# Patient Record
Sex: Female | Born: 1960 | Race: Black or African American | Hispanic: No | Marital: Married | Smoking: Never smoker
Health system: Southern US, Community
[De-identification: ages and names within clinical notes are randomized; demographics above are authoritative.]

## PROBLEM LIST (undated history)

## (undated) DIAGNOSIS — T7840XA Allergy, unspecified, initial encounter: Secondary | ICD-10-CM

## (undated) DIAGNOSIS — I10 Essential (primary) hypertension: Secondary | ICD-10-CM

## (undated) DIAGNOSIS — D649 Anemia, unspecified: Secondary | ICD-10-CM

## (undated) DIAGNOSIS — M1990 Unspecified osteoarthritis, unspecified site: Secondary | ICD-10-CM

## (undated) DIAGNOSIS — F419 Anxiety disorder, unspecified: Secondary | ICD-10-CM

## (undated) HISTORY — PX: COLONOSCOPY: SHX174

## (undated) HISTORY — PX: ENDOMETRIAL ABLATION: SHX621

## (undated) HISTORY — DX: Anxiety disorder, unspecified: F41.9

## (undated) HISTORY — DX: Allergy, unspecified, initial encounter: T78.40XA

## (undated) HISTORY — PX: BREAST SURGERY: SHX581

## (undated) HISTORY — PX: TUBAL LIGATION: SHX77

## (undated) HISTORY — DX: Unspecified osteoarthritis, unspecified site: M19.90

## (undated) HISTORY — DX: Anemia, unspecified: D64.9

## (undated) HISTORY — PX: HAND SURGERY: SHX662

---

## 2006-12-25 HISTORY — PX: KNEE SURGERY: SHX244

## 2008-11-29 ENCOUNTER — Emergency Department (HOSPITAL_BASED_OUTPATIENT_CLINIC_OR_DEPARTMENT_OTHER): Admission: EM | Admit: 2008-11-29 | Discharge: 2008-11-29 | Payer: Self-pay | Admitting: Emergency Medicine

## 2010-12-09 ENCOUNTER — Encounter: Payer: Self-pay | Admitting: Internal Medicine

## 2010-12-09 ENCOUNTER — Encounter: Payer: Self-pay | Admitting: Gastroenterology

## 2010-12-09 ENCOUNTER — Other Ambulatory Visit: Payer: Self-pay | Admitting: Internal Medicine

## 2010-12-09 ENCOUNTER — Ambulatory Visit (INDEPENDENT_AMBULATORY_CARE_PROVIDER_SITE_OTHER): Payer: PRIVATE HEALTH INSURANCE | Admitting: Internal Medicine

## 2010-12-09 VITALS — BP 128/80 | HR 89 | Temp 97.7°F | Resp 12 | Ht 61.0 in | Wt 175.0 lb

## 2010-12-09 DIAGNOSIS — J310 Chronic rhinitis: Secondary | ICD-10-CM | POA: Insufficient documentation

## 2010-12-09 DIAGNOSIS — I10 Essential (primary) hypertension: Secondary | ICD-10-CM | POA: Insufficient documentation

## 2010-12-09 DIAGNOSIS — G43909 Migraine, unspecified, not intractable, without status migrainosus: Secondary | ICD-10-CM | POA: Insufficient documentation

## 2010-12-09 DIAGNOSIS — E669 Obesity, unspecified: Secondary | ICD-10-CM

## 2010-12-09 DIAGNOSIS — M129 Arthropathy, unspecified: Secondary | ICD-10-CM

## 2010-12-09 DIAGNOSIS — T7840XA Allergy, unspecified, initial encounter: Secondary | ICD-10-CM | POA: Insufficient documentation

## 2010-12-09 DIAGNOSIS — M1990 Unspecified osteoarthritis, unspecified site: Secondary | ICD-10-CM | POA: Insufficient documentation

## 2010-12-09 DIAGNOSIS — D649 Anemia, unspecified: Secondary | ICD-10-CM

## 2010-12-09 DIAGNOSIS — E559 Vitamin D deficiency, unspecified: Secondary | ICD-10-CM

## 2010-12-09 LAB — CBC WITH DIFFERENTIAL/PLATELET
Basophils Relative: 1 % (ref 0–1)
HCT: 36.2 % (ref 36.0–46.0)
Hemoglobin: 11.6 g/dL — ABNORMAL LOW (ref 12.0–15.0)
Lymphocytes Relative: 36 % (ref 12–46)
Lymphs Abs: 1.9 10*3/uL (ref 0.7–4.0)
MCHC: 32 g/dL (ref 30.0–36.0)
Monocytes Absolute: 0.3 10*3/uL (ref 0.1–1.0)
Monocytes Relative: 6 % (ref 3–12)
Neutro Abs: 2.8 10*3/uL (ref 1.7–7.7)
Neutrophils Relative %: 54 % (ref 43–77)
RBC: 5.22 MIL/uL — ABNORMAL HIGH (ref 3.87–5.11)
WBC: 5.3 10*3/uL (ref 4.0–10.5)

## 2010-12-09 LAB — LIPID PANEL
LDL Cholesterol: 63 mg/dL (ref 0–99)
Triglycerides: 85 mg/dL (ref <150)

## 2010-12-09 LAB — COMPREHENSIVE METABOLIC PANEL
AST: 21 U/L (ref 0–37)
Albumin: 4.4 g/dL (ref 3.5–5.2)
Alkaline Phosphatase: 117 U/L (ref 39–117)
Calcium: 9.3 mg/dL (ref 8.4–10.5)
Chloride: 102 mEq/L (ref 96–112)
Glucose, Bld: 80 mg/dL (ref 70–99)
Potassium: 3.8 mEq/L (ref 3.5–5.3)
Sodium: 141 mEq/L (ref 135–145)
Total Protein: 7.8 g/dL (ref 6.0–8.3)

## 2010-12-09 LAB — TSH: TSH: 3.035 u[IU]/mL (ref 0.350–4.500)

## 2010-12-09 MED ORDER — DICLOFENAC SODIUM 1 % TD GEL: TRANSDERMAL | Status: DC

## 2010-12-09 NOTE — Progress Notes (Signed)
Subjective:     Patient ID: Samantha Horton, female   DOB: June 03, 1960, 50 y.o.   MRN: [id number]  HPI  New pt. First visit.  Overall doing well.  Concerned over weight gain.  HAD been on Phentermine and weight watcher in past and had lost over 30 lbbs.  Has gained bac 12 lbs.  Also would like to discuss ooptions of alternatives for knee arthritis.  Does not wish to take Meloxicam daily.  Had lazer surgery by Dr. Samuel Bouche on R knee.  No redness, edema or warmth to knee now  Allergies  Allergen Reactions  . Codeine Nausea And Vomiting  . Vicodin (Hydrocodone-Acetaminophen) Nausea And Vomiting   Past Medical History  Diagnosis Date  . Anemia   . Arthritis   . Headache     history of migraine per pt  . Allergy     allergic rhinitis   Past Surgical History  Procedure Date  . Knee surgery 12/2006    Right  . Endometrial ablation    History   Social History  . Marital Status: Married    Spouse Name: N/A    Number of Children: 3  . Years of Education: HS grad   Occupational History  . International aid/development worker    Social History Main Topics  . Smoking status: Never Smoker   . Smokeless tobacco: Never Used  . Alcohol Use: No  . Drug Use: No  . Sexually Active: Yes -- Female partner(s)   Other Topics Concern  . Not on file   Social History Narrative  . No narrative on file   Family History  Problem Relation Age of Onset  . Hypertension Mother   . Migraines Mother   . Arthritis Mother   . Hypertension Father    Patient Active Problem List  Diagnoses  . Essential hypertension, benign  . Arthritis  . Migraine  . Obesity  . Anemia  . Rhinitis  . Allergy         Review of Systems See HPI    Objective:   Physical Exam  Constitutional: She appears well-developed and well-nourished.  HENT:  Head: Normocephalic and atraumatic.  Neck: No thyromegaly present.  Cardiovascular: Normal rate.  Exam reveals no gallop and no friction rub.   No murmur heard.      No LE edema    Pulmonary/Chest: Breath sounds normal. She has no wheezes. She has no rales.  Musculoskeletal: She exhibits no edema and no tenderness.  Skin: Skin is warm. No rash noted.  Psychiatric: She has a normal mood and affect. Her behavior is normal.       Assessment:   Obesity with recent wt gain  Arthritis Complete problem list: Patient Active Problem List  Diagnoses  . Essential hypertension, benign  . Arthritis  . Migraine  . Obesity  . Anemia  . Rhinitis  . Allergy      Plan:     1)  Will refer to [name].  Check labs, TSH today 2)  Change Meloxicam to Voltaren gel patch,  2 gms bid  3)  Schedule CPE

## 2010-12-09 NOTE — Patient Instructions (Signed)
Will set up referall to [name].  Schedule Complete physical at your convenience

## 2010-12-10 ENCOUNTER — Other Ambulatory Visit (HOSPITAL_COMMUNITY)
Admission: RE | Admit: 2010-12-10 | Discharge: 2010-12-10 | Disposition: A | Discharge status: Home / Self Care | Payer: PRIVATE HEALTH INSURANCE | Source: Ambulatory Visit | Attending: Obstetrics and Gynecology | Admitting: Obstetrics and Gynecology

## 2010-12-10 ENCOUNTER — Other Ambulatory Visit: Payer: Self-pay | Admitting: Obstetrics and Gynecology

## 2010-12-10 DIAGNOSIS — Z01419 Encounter for gynecological examination (general) (routine) without abnormal findings: Secondary | ICD-10-CM | POA: Insufficient documentation

## 2010-12-10 DIAGNOSIS — N842 Polyp of vagina: Secondary | ICD-10-CM | POA: Insufficient documentation

## 2010-12-10 DIAGNOSIS — Z1159 Encounter for screening for other viral diseases: Secondary | ICD-10-CM | POA: Insufficient documentation

## 2010-12-12 ENCOUNTER — Telehealth: Payer: Self-pay | Admitting: Internal Medicine

## 2010-12-12 DIAGNOSIS — D649 Anemia, unspecified: Secondary | ICD-10-CM

## 2010-12-12 MED ORDER — FERROUS SULFATE 325 (65 FE) MG PO TBEC: 325.0000 mg | DELAYED_RELEASE_TABLET | Freq: Every day | ORAL | Status: DC

## 2010-12-12 NOTE — Telephone Encounter (Signed)
Will have Samantha Horton call pt and begin Fe

## 2010-12-13 NOTE — Telephone Encounter (Signed)
Spoke with pt.  She is agreeable to iron supplement.  She states she does better historically with liquid iron supplement.  She thinks she still has refills left on liquid supplement she was given previously. She will check with her pharmacy today.  If she does not have a refill, she will call the office and have DDS write for refill of liquid iron rather than pill form.

## 2010-12-24 ENCOUNTER — Other Ambulatory Visit: Payer: Self-pay | Admitting: Obstetrics and Gynecology

## 2011-01-16 ENCOUNTER — Encounter: Payer: PRIVATE HEALTH INSURANCE | Admitting: Gastroenterology

## 2011-01-16 ENCOUNTER — Ambulatory Visit (INDEPENDENT_AMBULATORY_CARE_PROVIDER_SITE_OTHER): Payer: PRIVATE HEALTH INSURANCE | Admitting: Internal Medicine

## 2011-01-16 ENCOUNTER — Encounter: Payer: Self-pay | Admitting: *Deleted

## 2011-01-16 VITALS — BP 126/84 | HR 91 | Temp 97.4°F | Resp 12 | Ht 61.0 in | Wt 176.0 lb

## 2011-01-16 DIAGNOSIS — Z0000 Encounter for general adult medical examination without abnormal findings: Secondary | ICD-10-CM

## 2011-01-16 DIAGNOSIS — I10 Essential (primary) hypertension: Secondary | ICD-10-CM

## 2011-01-16 DIAGNOSIS — R350 Frequency of micturition: Secondary | ICD-10-CM

## 2011-01-16 DIAGNOSIS — D649 Anemia, unspecified: Secondary | ICD-10-CM

## 2011-01-16 LAB — POCT URINALYSIS DIPSTICK
Bilirubin, UA: NEGATIVE
Blood, UA: NEGATIVE
Glucose, UA: NEGATIVE
Ketones, UA: NEGATIVE
Leukocytes, UA: NEGATIVE
Nitrite, UA: NEGATIVE
Protein, UA: NEGATIVE
Spec Grav, UA: 1.015
pH, UA: 6

## 2011-01-16 NOTE — Patient Instructions (Signed)
Should have been referred to Dr [name] for weight gain, since he does weight management and GI I have called Dr Bishop [place] office and they will see pt today and get her an appt with Dr [name].

## 2011-01-16 NOTE — Progress Notes (Signed)
Subjective:    Patient ID: Samantha Horton, female    DOB: 01/11/1961, 50 y.o.   MRN: [id number]  HPI  Samantha Horton returns for recheck of BP and anemia and headaches.  She does not wish a CPE as she recently saw Dr. Neva Seat her GYN.  She is doing well.  Tolerating meds and states her migraines are much less frequent  She has some urinary frequency but no burning or pain  Dr. Chilton Si did a breast exam and pap per her report.  She has not seend [name] as yet but will talk to him about a colonoscopy which she would like to have around Thanksgiving  Allergies  Allergen Reactions  . Codeine Nausea And Vomiting  . Vicodin (Hydrocodone-Acetaminophen) Nausea And Vomiting   Past Medical History  Diagnosis Date  . Anemia   . Arthritis   . Headache     history of migraine per pt  . Allergy     allergic rhinitis   Past Surgical History  Procedure Date  . Knee surgery 12/2006    Right  . Endometrial ablation    History   Social History  . Marital Status: Married    Spouse Name: N/A    Number of Children: 3  . Years of Education: HS grad   Occupational History  .    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 Smoking status: Never Smoker   . Smokeless tobacco: Never Used  . Alcohol Use: No  . Drug Use: No  . Sexually Active: Yes -- Female partner(s)   Other Topics Concern  . Not on file   Social History Narrative  . No narrative on file   Family History  Problem Relation Age of Onset  . Hypertension Mother   . Migraines Mother   . Arthritis Mother   . Hypertension Father    Patient Active Problem List  Diagnoses  . Essential hypertension, benign  . Arthritis  . Migraine  . Obesity  . Anemia  . Rhinitis  . Allergy   Current Outpatient Prescriptions on File Prior to Visit  Medication Sig Dispense Refill  . azelastine (ASTELIN) 137 MCG/SPRAY nasal spray Place 2 sprays into the nose daily. Use in each nostril as directed       . diclofenac sodium (VOLTAREN) 1 %  GEL Apply 2 gms to skin BId  1 Tube  0  . DIOVAN HCT 160-25 MG per tablet Take 1 tablet by mouth Daily.      Marland Kitchen KLOR-CON 10 10 MEQ CR tablet Take 1 tablet by mouth Daily.      . meloxicam (MOBIC) 15 MG tablet Take 15 mg by mouth daily as needed.              Review of Systems    No chest pain no SOB  No edem Objective:   Physical Exam Physical Exam  Nursing note and vitals reviewed.  Constitutional: She is oriented to person, place, and time. She appears well-developed and well-nourished.  HENT:  Head: Normocephalic and atraumatic.  Cardiovascular: Normal rate and regular rhythm. Exam reveals no gallop and no friction rub.  No murmur heard.  Pulmonary/Chest: Breath sounds normal. She has no wheezes. She has no rales.  Neurological: She is alert and oriented to person, place, and time.  Skin: Skin is warm and dry.  Psychiatric: She has a normal mood and affect. Her behavior is normal.  Assessment & Plan:  HTN:  Well controlled.l  She does not wish an EKG today despite my counsel to have one Urinary frequencey:  U/A neg today Anemia  HGB improved from past .  Will give NOvoferrum 21 tsp daily.  She had endometrial ablation by Dr. Neva Seat Migraine headache  ;  Much less frequent

## 2011-01-16 NOTE — Progress Notes (Signed)
This encounter was created in error - please disregard.

## 2011-03-13 ENCOUNTER — Ambulatory Visit (INDEPENDENT_AMBULATORY_CARE_PROVIDER_SITE_OTHER): Payer: PRIVATE HEALTH INSURANCE | Admitting: Internal Medicine

## 2011-03-13 VITALS — BP 126/78 | HR 95 | Temp 97.8°F | Resp 16 | Ht 61.0 in | Wt 174.0 lb

## 2011-03-13 DIAGNOSIS — I10 Essential (primary) hypertension: Secondary | ICD-10-CM

## 2011-03-13 NOTE — Progress Notes (Signed)
Copy of EKG faxed to Dr. Ritta Slot

## 2011-03-13 NOTE — Progress Notes (Signed)
Pt here for 12 lead EKG  Per her GI physician's  request.  Copy of EKG will be faxed to his office

## 2011-08-23 ENCOUNTER — Encounter (HOSPITAL_BASED_OUTPATIENT_CLINIC_OR_DEPARTMENT_OTHER): Payer: Self-pay | Admitting: *Deleted

## 2011-08-23 ENCOUNTER — Emergency Department (HOSPITAL_BASED_OUTPATIENT_CLINIC_OR_DEPARTMENT_OTHER)
Admission: EM | Admit: 2011-08-23 | Discharge: 2011-08-24 | Disposition: A | Discharge status: Home / Self Care | Payer: PRIVATE HEALTH INSURANCE | Attending: Emergency Medicine | Admitting: Emergency Medicine

## 2011-08-23 DIAGNOSIS — R51 Headache: Secondary | ICD-10-CM | POA: Insufficient documentation

## 2011-08-23 DIAGNOSIS — J3489 Other specified disorders of nose and nasal sinuses: Secondary | ICD-10-CM | POA: Insufficient documentation

## 2011-08-23 DIAGNOSIS — R05 Cough: Secondary | ICD-10-CM | POA: Insufficient documentation

## 2011-08-23 DIAGNOSIS — J069 Acute upper respiratory infection, unspecified: Secondary | ICD-10-CM | POA: Insufficient documentation

## 2011-08-23 DIAGNOSIS — R059 Cough, unspecified: Secondary | ICD-10-CM | POA: Insufficient documentation

## 2011-08-23 DIAGNOSIS — R509 Fever, unspecified: Secondary | ICD-10-CM | POA: Insufficient documentation

## 2011-08-23 MED ORDER — DIPHENHYDRAMINE HCL 50 MG/ML IJ SOLN
25.0000 mg | Freq: Once | INTRAMUSCULAR | Status: AC
  Administered 2011-08-23: 25 mg via INTRAMUSCULAR
  Filled 2011-08-23: qty 1

## 2011-08-23 MED ORDER — METOCLOPRAMIDE HCL 5 MG/ML IJ SOLN
10.0000 mg | Freq: Once | INTRAMUSCULAR | Status: AC
  Administered 2011-08-23: 10 mg via INTRAMUSCULAR
  Filled 2011-08-23: qty 2

## 2011-08-23 MED ORDER — ACETAMINOPHEN 500 MG PO TABS
1000.0000 mg | ORAL_TABLET | Freq: Once | ORAL | Status: AC
  Administered 2011-08-23: 1000 mg via ORAL
  Filled 2011-08-23: qty 2

## 2011-08-23 MED ORDER — KETOROLAC TROMETHAMINE 60 MG/2ML IM SOLN
60.0000 mg | Freq: Once | INTRAMUSCULAR | Status: AC
  Administered 2011-08-23: 60 mg via INTRAMUSCULAR
  Filled 2011-08-23: qty 2

## 2011-08-23 NOTE — ED Notes (Signed)
Pt reports sinus pain since Thursday with cough- pt seen at [facility] today but did not receive any rx

## 2011-08-23 NOTE — Discharge Instructions (Signed)
Antibiotic Nonuse  Your caregiver felt that the infection or problem was not one that would be helped with an antibiotic. Infections may be caused by viruses or bacteria. Only a caregiver can tell which one of these is the likely cause of an illness. A cold is the most common cause of infection in both adults and children. A cold is a virus. Antibiotic treatment will have no effect on a viral infection. Viruses can lead to many lost days of work caring for sick children and many missed days of school. Children may catch as many as 10 "colds" or "flus" per year during which they can be tearful, cranky, and uncomfortable. The goal of treating a virus is aimed at keeping the ill person comfortable. Antibiotics are medications used to help the body fight bacterial infections. There are relatively few types of bacteria that cause infections but there are hundreds of viruses. While both viruses and bacteria cause infection they are very different types of germs. A viral infection will typically go away by itself within 7 to 10 days. Bacterial infections may spread or get worse without antibiotic treatment. Examples of bacterial infections are:  Sore throats (like strep throat or tonsillitis).   Infection in the lung (pneumonia).   Ear and skin infections.  Examples of viral infections are:  Colds or flus.   Most coughs and bronchitis.   Sore throats not caused by Strep.   Runny noses.  It is often best not to take an antibiotic when a viral infection is the cause of the problem. Antibiotics can kill off the helpful bacteria that we have inside our body and allow harmful bacteria to start growing. Antibiotics can cause side effects such as allergies, nausea, and diarrhea without helping to improve the symptoms of the viral infection. Additionally, repeated uses of antibiotics can cause bacteria inside of our body to become resistant. That resistance can be passed onto harmful bacterial. The next time  you have an infection it may be harder to treat if antibiotics are used when they are not needed. Not treating with antibiotics allows our own immune system to develop and take care of infections more efficiently. Also, antibiotics will work better for [place] when they are prescribed for bacterial infections. Treatments for a child that is ill may include:  Give extra fluids throughout the day to stay hydrated.   Get plenty of rest.   Only give your child over-the-counter or prescription medicines for pain, discomfort, or fever as directed by your caregiver.   The use of a cool mist humidifier may help stuffy noses.   Cold medications if suggested by your caregiver.  Your caregiver may decide to start you on an antibiotic if:  The problem you were seen for today continues for a longer length of time than expected.   You develop a secondary bacterial infection.  SEEK MEDICAL CARE IF:  Fever lasts longer than 5 days.   Symptoms continue to get worse after 5 to 7 days or become severe.   Difficulty in breathing develops.   Signs of dehydration develop (poor drinking, rare urinating, dark colored urine).   Changes in behavior or worsening tiredness (listlessness or lethargy).  Document Released: 07/21/2001 Document Revised: 05/01/2011 Document Reviewed: 01/17/2009 [facility] Patient Information 2012 [place], [place].Headache Headaches are caused by many different problems. Most commonly, headache is caused by muscle tension from an injury, fatigue, or emotional upset. Excessive muscle contractions in the scalp and neck result in a headache that often feels  like a tight band around the head. Tension headaches often have areas of tenderness over the scalp and the back of the neck. These headaches may last for hours, days, or longer, and some may contribute to migraines in those who have migraine problems. Migraines usually cause a throbbing headache, which is made worse by activity. Sometimes only  one side of the head hurts. Nausea, vomiting, eye pain, and avoidance of food are common with migraines. Visual symptoms such as light sensitivity, blind spots, or flashing lights may also occur. Loud noises may worsen migraine headaches. Many factors may cause migraine headaches:  Emotional stress, lack of sleep, and menstrual periods.   Alcohol and some drugs (such as birth control pills).   Diet factors (fasting, caffeine, food preservatives, chocolate).   Environmental factors (weather changes, bright lights, odors, smoke).  Other causes of headaches include minor injuries to the head. Arthritis in the neck; problems with the jaw, eyes, ears, or nose are also causes of headaches. Allergies, drugs, alcohol, and exposure to smoke can also cause moderate headaches. Rebound headaches can occur if someone uses pain medications for a long period of time and then stops. Less commonly, blood vessel problems in the neck and brain (including stroke) can cause various types of headache. Treatment of headaches includes medicines for pain and relaxation. Ice packs or heat applied to the back of the head and neck help some people. Massaging the shoulders, neck and scalp are often very useful. Relaxation techniques and stretching can help prevent these headaches. Avoid alcohol and cigarette smoking as these tend to make headaches worse. Please see your caregiver if your headache is not better in 2 days.  SEEK IMMEDIATE MEDICAL CARE IF:   You develop a high fever, chills, or repeated vomiting.   You faint or have difficulty with vision.   You develop unusual numbness or weakness of your arms or legs.   Relief of pain is inadequate with medication, or you develop severe pain.   You develop confusion, or neck stiffness.   You have a worsening of a headache or do not obtain relief.  Document Released: 05/12/2005 Document Revised: 05/01/2011 Document Reviewed: 11/05/2006 [facility] Patient Information 2012  [place], [place].Upper Respiratory Infection, Adult An upper respiratory infection (URI) is also known as the common cold. It is often caused by a type of germ (virus). Colds are easily spread (contagious). You can pass it to others by kissing, coughing, sneezing, or drinking out of the same glass. Usually, you get better in 1 or 2 weeks.  HOME CARE   Only take medicine as told by your doctor.   Use a warm mist humidifier or breathe in steam from a hot shower.   Drink enough water and fluids to keep your pee (urine) clear or pale yellow.   Get plenty of rest.   Return to work when your temperature is back to normal or as told by your doctor. You may use a face mask and wash your hands to stop your cold from spreading.  GET HELP RIGHT AWAY IF:   After the first few days, you feel you are getting worse.   You have questions about your medicine.   You have chills, shortness of breath, or brown or red spit (mucus).   You have yellow or brown snot (nasal discharge) or pain in the face, especially when you bend forward.   You have a fever, puffy (swollen) neck, pain when you swallow, or white spots in the back of  your throat.   You have a bad headache, ear pain, sinus pain, or chest pain.   You have a high-pitched whistling sound when you breathe in and out (wheezing).   You have a lasting cough or cough up blood.   You have sore muscles or a stiff neck.  MAKE SURE YOU:   Understand these instructions.   Will watch your condition.   Will get help right away if you are not doing well or get worse.  Document Released: 10/29/2007 Document Revised: 05/01/2011 Document Reviewed: 09/16/2010 [facility] Patient Information 2012 [place], [place].

## 2011-08-23 NOTE — ED Provider Notes (Signed)
History     CSN: [id number]  Arrival date & time 08/23/11  1945   First MD Initiated Contact with Patient 08/23/11 2150      Chief Complaint  Patient presents with  . Cough    (Consider location/radiation/quality/duration/timing/severity/associated sxs/prior treatment) HPI Comments: Pt was seen by pcp today and they told her no medications where needed at this time:pt states that she has a history of migraines and she feels like she has one right now  Patient is a 51 y.o. female presenting with cough. The history is provided by the patient. No language interpreter was used.  Cough This is a new problem. The current episode started 2 days ago. The problem occurs constantly. The problem has not changed since onset.The cough is non-productive. The maximum temperature recorded prior to her arrival was 101 to 101.9 F. Associated symptoms include headaches and rhinorrhea. She has tried nothing for the symptoms. She is not a smoker.    Past Medical History  Diagnosis Date  . Anemia   . Arthritis   . Headache     history of migraine per pt  . Allergy     allergic rhinitis    Past Surgical History  Procedure Date  . Knee surgery 12/2006    Right  . Endometrial ablation     Family History  Problem Relation Age of Onset  . Hypertension Mother   . Migraines Mother   . Arthritis Mother   . Hypertension Father     History  Substance Use Topics  . Smoking status: Never Smoker   . Smokeless tobacco: Never Used  . Alcohol Use: No    OB History    Grav Para Term Preterm Abortions TAB SAB Ect Mult Living                  Review of Systems  Constitutional: Negative.   HENT: Positive for rhinorrhea.   Respiratory: Positive for cough.   Cardiovascular: Negative.   Neurological: Positive for headaches.    Allergies  Codeine and Vicodin  Home Medications   Current Outpatient Rx  Name Route Sig Dispense Refill  . VITAMIN C PO Oral Take 1 tablet by mouth daily.    .  AZELASTINE HCL 137 MCG/SPRAY NA SOLN Nasal Place 2 sprays into the nose daily. Use in each nostril as directed     . DIOVAN HCT 160-25 MG PO TABS Oral Take 1 tablet by mouth Daily.      BP 137/68  Pulse 102  Temp(Src) 99.9 F (37.7 C) (Oral)  Resp 18  Ht 5\' 1"  (1.549 m)  Wt 165 lb (74.844 kg)  BMI 31.18 kg/m2  SpO2 96%  Physical Exam  Nursing note and vitals reviewed. Constitutional: She is oriented to person, place, and time. She appears well-developed and well-nourished.  HENT:  Right Ear: External ear normal.  Left Ear: External ear normal.  Nose: Rhinorrhea present.  Mouth/Throat: Posterior oropharyngeal erythema present.  Eyes: Conjunctivae and EOM are normal.  Neck: Normal range of motion. Neck supple.  Cardiovascular: Normal rate and regular rhythm.   Pulmonary/Chest: Effort normal and breath sounds normal.  Musculoskeletal: Normal range of motion.  Neurological: She is alert and oriented to person, place, and time.  Skin: Skin is warm and dry.    ED Course  Procedures (including critical care time)  Labs Reviewed - No data to display No results found.   1. Headache   2. URI (upper respiratory infection)  MDM  Pt headache resolved:symptoms likely viral        Teressa Lower, NP 08/23/11 2352  Teressa Lower, NP 08/23/11 2358

## 2011-08-24 NOTE — ED Provider Notes (Signed)
Medical screening examination/treatment/procedure(s) were performed by non-physician practitioner and as supervising physician I was immediately available for consultation/collaboration.  Benedetta Sundstrom, MD 08/24/11 0029 

## 2011-10-16 ENCOUNTER — Ambulatory Visit: Payer: PRIVATE HEALTH INSURANCE | Admitting: Internal Medicine

## 2011-10-30 ENCOUNTER — Ambulatory Visit: Payer: PRIVATE HEALTH INSURANCE | Admitting: Internal Medicine

## 2012-01-29 ENCOUNTER — Other Ambulatory Visit: Payer: Self-pay | Admitting: Obstetrics and Gynecology

## 2012-01-29 ENCOUNTER — Other Ambulatory Visit (HOSPITAL_COMMUNITY)
Admission: RE | Admit: 2012-01-29 | Discharge: 2012-01-29 | Disposition: A | Discharge status: Home / Self Care | Payer: PRIVATE HEALTH INSURANCE | Source: Ambulatory Visit | Attending: Obstetrics and Gynecology | Admitting: Obstetrics and Gynecology

## 2012-01-29 DIAGNOSIS — Z124 Encounter for screening for malignant neoplasm of cervix: Secondary | ICD-10-CM | POA: Insufficient documentation

## 2012-01-29 DIAGNOSIS — Z1151 Encounter for screening for human papillomavirus (HPV): Secondary | ICD-10-CM | POA: Insufficient documentation

## 2012-02-04 ENCOUNTER — Telehealth: Payer: Self-pay | Admitting: *Deleted

## 2012-02-11 NOTE — Telephone Encounter (Signed)
appt made per pt request 

## 2012-02-12 ENCOUNTER — Ambulatory Visit: Payer: PRIVATE HEALTH INSURANCE | Admitting: Internal Medicine

## 2012-02-26 ENCOUNTER — Encounter: Payer: Self-pay | Admitting: Internal Medicine

## 2012-02-26 ENCOUNTER — Ambulatory Visit (INDEPENDENT_AMBULATORY_CARE_PROVIDER_SITE_OTHER): Payer: PRIVATE HEALTH INSURANCE | Admitting: Internal Medicine

## 2012-02-26 VITALS — BP 128/74 | HR 101 | Temp 97.9°F | Resp 18 | Wt 170.0 lb

## 2012-02-26 DIAGNOSIS — I10 Essential (primary) hypertension: Secondary | ICD-10-CM

## 2012-02-26 DIAGNOSIS — M25512 Pain in left shoulder: Secondary | ICD-10-CM

## 2012-02-26 DIAGNOSIS — D649 Anemia, unspecified: Secondary | ICD-10-CM

## 2012-02-26 DIAGNOSIS — Z8742 Personal history of other diseases of the female genital tract: Secondary | ICD-10-CM

## 2012-02-26 DIAGNOSIS — N6489 Other specified disorders of breast: Secondary | ICD-10-CM

## 2012-02-26 DIAGNOSIS — M25519 Pain in unspecified shoulder: Secondary | ICD-10-CM

## 2012-02-26 NOTE — Progress Notes (Signed)
Subjective:    Patient ID: Samantha Horton, female    DOB: Apr 03, 1961, 51 y.o.   MRN: [id number]  HPI  [name] is here for acute visit.  She is having shoulder, neck and upper back pain from heavy breasts.    She has lost approx 30 lbs with [name].  Right now she is taking diethylpropion but stopped because she says her alk phos is slightly elevated.  She is not sure if med is causing this and she sees [name] in am  She had colonoscopy with [name] and no polyps.  Allergies  Allergen Reactions  . Codeine Nausea And Vomiting  . Vicodin (Hydrocodone-Acetaminophen) Nausea And Vomiting   Past Medical History  Diagnosis Date  . Anemia   . Arthritis   . Headache     history of migraine per pt  . Allergy     allergic rhinitis   Past Surgical History  Procedure Date  . Knee surgery 12/2006    Right  . Endometrial ablation    History   Social History  . Marital Status: Married    Spouse Name: N/A    Number of Children: 3  . Years of Education: HS grad   Occupational History  .    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 Smoking status: Never Smoker   . Smokeless tobacco: Never Used  . Alcohol Use: No  . Drug Use: No  . Sexually Active: Yes -- Female partner(s)    Birth Control/ Protection: Surgical   Other Topics Concern  . Not on file   Social History Narrative  . No narrative on file   Family History  Problem Relation Age of Onset  . Hypertension Mother   . Migraines Mother   . Arthritis Mother   . Hypertension Father    Patient Active Problem List  Diagnosis  . Essential hypertension, benign  . Arthritis  . Migraine  . Obesity  . Anemia  . Rhinitis  . Allergy   Current Outpatient Prescriptions on File Prior to Visit  Medication Sig Dispense Refill  . azelastine (ASTELIN) 137 MCG/SPRAY nasal spray Place 2 sprays into the nose daily. Use in each nostril as directed       . Diethylpropion HCl 25 MG TABS Take by mouth.      . DIOVAN  HCT 160-25 MG per tablet Take 1 tablet by mouth Daily.      Marland Kitchen Linaclotide (LINZESS) 145 MCG CAPS Take by mouth daily.      . Ascorbic Acid (VITAMIN C PO) Take 1 tablet by mouth daily.          Review of Systems See HPI    Objective:   Physical Exam Physical Exam  Nursing note and vitals reviewed.  Constitutional: She is oriented to person, place, and time. She appears well-developed and well-nourished.  HENT:  Head: Normocephalic and atraumatic.  Cardiovascular: Normal rate and regular rhythm. Exam reveals no gallop and no friction rub.  No murmur heard.  Pulmonary/Chest: Breath sounds normal. She has no wheezes. She has no rales. She Neurological: She is alert and oriented to person, place, and time.  Skin: Skin is warm and dry.  Psychiatric: She has a normal mood and affect. Her behavior is normal.         Assessment & Plan:  Shoulder back pain due to pendulous breasts  Gave number to [name] for consultation  Elevated alk phos  Pt to  see [name] in am  Anemia  Resolved with ablation  Will give influenza today

## 2012-02-26 NOTE — Patient Instructions (Addendum)
Dr. Odis Luster:  Plastic surgeon  Phone number:  [phone]

## 2012-07-10 ENCOUNTER — Other Ambulatory Visit: Payer: Self-pay

## 2012-07-22 ENCOUNTER — Other Ambulatory Visit: Payer: Self-pay | Admitting: Plastic Surgery

## 2012-07-29 ENCOUNTER — Telehealth: Payer: Self-pay | Admitting: Internal Medicine

## 2012-07-29 ENCOUNTER — Encounter (HOSPITAL_BASED_OUTPATIENT_CLINIC_OR_DEPARTMENT_OTHER): Payer: Self-pay | Admitting: Family Medicine

## 2012-07-29 ENCOUNTER — Emergency Department (HOSPITAL_BASED_OUTPATIENT_CLINIC_OR_DEPARTMENT_OTHER)
Admission: EM | Admit: 2012-07-29 | Discharge: 2012-07-29 | Disposition: A | Discharge status: Home / Self Care | Payer: PRIVATE HEALTH INSURANCE | Attending: Emergency Medicine | Admitting: Emergency Medicine

## 2012-07-29 DIAGNOSIS — Z8739 Personal history of other diseases of the musculoskeletal system and connective tissue: Secondary | ICD-10-CM | POA: Insufficient documentation

## 2012-07-29 DIAGNOSIS — E876 Hypokalemia: Secondary | ICD-10-CM | POA: Insufficient documentation

## 2012-07-29 DIAGNOSIS — Z79899 Other long term (current) drug therapy: Secondary | ICD-10-CM | POA: Insufficient documentation

## 2012-07-29 DIAGNOSIS — I10 Essential (primary) hypertension: Secondary | ICD-10-CM | POA: Insufficient documentation

## 2012-07-29 DIAGNOSIS — Z862 Personal history of diseases of the blood and blood-forming organs and certain disorders involving the immune mechanism: Secondary | ICD-10-CM | POA: Insufficient documentation

## 2012-07-29 DIAGNOSIS — Z8679 Personal history of other diseases of the circulatory system: Secondary | ICD-10-CM | POA: Insufficient documentation

## 2012-07-29 DIAGNOSIS — Z8709 Personal history of other diseases of the respiratory system: Secondary | ICD-10-CM | POA: Insufficient documentation

## 2012-07-29 HISTORY — DX: Essential (primary) hypertension: I10

## 2012-07-29 LAB — BASIC METABOLIC PANEL
CO2: 31 mEq/L (ref 19–32)
Calcium: 9.6 mg/dL (ref 8.4–10.5)
Chloride: 98 mEq/L (ref 96–112)
Glucose, Bld: 113 mg/dL — ABNORMAL HIGH (ref 70–99)
Sodium: 140 mEq/L (ref 135–145)

## 2012-07-29 LAB — CBC
Hemoglobin: 10.1 g/dL — ABNORMAL LOW (ref 12.0–15.0)
MCH: 22.5 pg — ABNORMAL LOW (ref 26.0–34.0)
RBC: 4.48 MIL/uL (ref 3.87–5.11)

## 2012-07-29 MED ORDER — POTASSIUM CHLORIDE CRYS ER 20 MEQ PO TBCR
40.0000 meq | EXTENDED_RELEASE_TABLET | Freq: Once | ORAL | Status: AC
  Administered 2012-07-29: 40 meq via ORAL
  Filled 2012-07-29: qty 2

## 2012-07-29 MED ORDER — POTASSIUM CHLORIDE CRYS ER 20 MEQ PO TBCR: 20.0000 meq | EXTENDED_RELEASE_TABLET | Freq: Every day | ORAL | Status: DC

## 2012-07-29 NOTE — Telephone Encounter (Signed)
Advised pt be seen in ED for a reported K+ of 2.8 following breast reduction surgery. Pt reports she received a phone call from her surgeons nurse telling her to contact PCP for rx

## 2012-07-29 NOTE — Telephone Encounter (Signed)
Pt states she had breast reduction surgery and she was told her potassium was LOW... She would like to know if a prescription can be called into [facility] IN [place]... PLEASE CALL PT AT [phone]

## 2012-07-29 NOTE — ED Provider Notes (Signed)
History     CSN: [id number]  Arrival date & time 07/29/12  1711   First MD Initiated Contact with Patient 07/29/12 1728      Chief Complaint  Patient presents with  . potassium check     (Consider location/radiation/quality/duration/timing/severity/associated sxs/prior treatment) HPI Comments: Patient comes to the ER for concerns over potassium issues. Patient reports that she has a history of low potassium. In the past she has been on potassium replacement but recently has not been. She had breast reduction surgery last week and preop labs showed that her potassium was 2.8. She was to follow up with her doctor, but has not done so. She has become very anxious about the potassium and comes to the ER for a tach. Patient denies blurred vision, headache, chest pain, palpitations, difficulty breathing.   Past Medical History  Diagnosis Date  . Anemia   . Arthritis   . Headache     history of migraine per pt  . Allergy     allergic rhinitis  . Hypertension     Past Surgical History  Procedure Laterality Date  . Knee surgery  12/2006    Right  . Endometrial ablation    . Breast surgery      Family History  Problem Relation Age of Onset  . Hypertension Mother   . Migraines Mother   . Arthritis Mother   . Hypertension Father     History  Substance Use Topics  . Smoking status: Never Smoker   . Smokeless tobacco: Never Used  . Alcohol Use: No    OB History   Grav Para Term Preterm Abortions TAB SAB Ect Mult Living                  Review of Systems  All other systems reviewed and are negative.    Allergies  Codeine and Vicodin  Home Medications   Current Outpatient Rx  Name  Route  Sig  Dispense  Refill  . enoxaparin (LOVENOX) 40 MG/0.4ML injection   Subcutaneous   Inject 40 mg into the skin daily.         . methocarbamol (ROBAXIN) 500 MG tablet   Oral   Take 500 mg by mouth 4 (four) times daily.         . Ascorbic Acid (VITAMIN C PO)   Oral  Take 1 tablet by mouth daily.         Marland Kitchen azelastine (ASTELIN) 137 MCG/SPRAY nasal spray   Nasal   Place 2 sprays into the nose daily. Use in each nostril as directed          . Diethylpropion HCl 25 MG TABS   Oral   Take by mouth.         . DIOVAN HCT 160-25 MG per tablet   Oral   Take 1 tablet by mouth Daily.         Marland Kitchen Linaclotide (LINZESS) 145 MCG CAPS   Oral   Take by mouth daily.           BP 123/62  Pulse 102  Temp(Src) 98.2 F (36.8 C) (Oral)  Resp 16  SpO2 100%  Physical Exam  Constitutional: She is oriented to person, place, and time. She appears well-developed and well-nourished. No distress.  HENT:  Head: Normocephalic and atraumatic.  Right Ear: Hearing normal.  Nose: Nose normal.  Mouth/Throat: Oropharynx is clear and moist and mucous membranes are normal.  Eyes: Conjunctivae and EOM are normal. Pupils  are equal, round, and reactive to light.  Neck: Normal range of motion. Neck supple.  Cardiovascular: Normal rate, regular rhythm, S1 normal and S2 normal.  Exam reveals no gallop and no friction rub.   No murmur heard. Pulmonary/Chest: Effort normal and breath sounds normal. No respiratory distress. She exhibits no tenderness.  Abdominal: Soft. Normal appearance and bowel sounds are normal. There is no hepatosplenomegaly. There is no tenderness. There is no rebound, no guarding, no tenderness at McBurney's point and negative Murphy's sign. No hernia.  Musculoskeletal: Normal range of motion.  Neurological: She is alert and oriented to person, place, and time. She has normal strength. No cranial nerve deficit or sensory deficit. Coordination normal. GCS eye subscore is 4. GCS verbal subscore is 5. GCS motor subscore is 6.  Skin: Skin is warm, dry and intact. No rash noted. No cyanosis.  Psychiatric: She has a normal mood and affect. Her speech is normal and behavior is normal. Thought content normal.    ED Course  Procedures (including critical care  time)  Labs Reviewed  CBC - Abnormal; Notable for the following:    Hemoglobin 10.1 (*)    HCT 30.5 (*)    MCV 68.1 (*)    MCH 22.5 (*)    Platelets 513 (*)    All other components within normal limits  BASIC METABOLIC PANEL - Abnormal; Notable for the following:    Potassium 3.3 (*)    Glucose, Bld 113 (*)    All other components within normal limits   No results found.   Diagnosis: Hypokalemia    MDM  Patient shows me blood work that confirmed potassium of 2.8 on February 27. Repeat today, however was improved at 3.3. Patient administered 40 mEq by mouth twice a day 20 mEq a day for a week, followup with her doctor for a recheck.        Gilda Crease, MD 07/29/12 [phone]

## 2012-07-29 NOTE — ED Notes (Signed)
Pt sts she was told last Thursday that her potassium was 2.8. sts she feels "fine"

## 2012-07-29 NOTE — ED Notes (Signed)
Pt reports she is here to have Potassium checked. She does not have any complaints at this time.

## 2012-08-05 ENCOUNTER — Encounter: Payer: Self-pay | Admitting: Internal Medicine

## 2012-08-05 ENCOUNTER — Ambulatory Visit (INDEPENDENT_AMBULATORY_CARE_PROVIDER_SITE_OTHER): Payer: PRIVATE HEALTH INSURANCE | Admitting: Internal Medicine

## 2012-08-05 VITALS — BP 126/76 | HR 98 | Temp 98.3°F | Resp 18 | Ht 61.0 in | Wt 158.0 lb

## 2012-08-05 DIAGNOSIS — E876 Hypokalemia: Secondary | ICD-10-CM

## 2012-08-05 DIAGNOSIS — D649 Anemia, unspecified: Secondary | ICD-10-CM

## 2012-08-05 MED ORDER — POTASSIUM CHLORIDE CRYS ER 20 MEQ PO TBCR: 20.0000 meq | EXTENDED_RELEASE_TABLET | Freq: Every day | ORAL | Status: DC

## 2012-08-05 NOTE — Progress Notes (Signed)
Subjective:    Patient ID: Samantha Horton, female    DOB: 1960/12/18, 52 y.o.   MRN: [id number]  HPI Samantha Horton is here for acute visit  She has had her breast reduction surgery on 2/27 and doing well post op.  She is very happy with results  Pre-op eval revealed a K+ of 2.8  She tells me anesthesiologist did an EKG and it was OK but did not replace her K.  She had her surgery..  One week ago she was counseled by me to get her K rechecked.  It was 3.3 1 week ago.  She was given K 20 meq by ER MD  Pt feeling fine . She does take Diovan/HCTZ for her BP.  She reports K has been low in the past  Allergies  Allergen Reactions  . Codeine Nausea And Vomiting  . Vicodin (Hydrocodone-Acetaminophen) Nausea And Vomiting   Past Medical History  Diagnosis Date  . Anemia   . Arthritis   . Headache     history of migraine per pt  . Allergy     allergic rhinitis  . Hypertension    Past Surgical History  Procedure Laterality Date  . Knee surgery  12/2006    Right  . Endometrial ablation    . Breast surgery     History   Social History  . Marital Status: Married    Spouse Name: N/A    Number of Children: 3  . Years of Education: HS grad   Occupational History  .    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 Smoking status: Never Smoker   . Smokeless tobacco: Never Used  . Alcohol Use: No  . Drug Use: No  . Sexually Active: Yes -- Female partner(s)    Birth Control/ Protection: Surgical   Other Topics Concern  . Not on file   Social History Narrative  . No narrative on file   Family History  Problem Relation Age of Onset  . Hypertension Mother   . Migraines Mother   . Arthritis Mother   . Hypertension Father    Patient Active Problem List  Diagnosis  . Essential hypertension, benign  . Arthritis  . Migraine  . Obesity  . Anemia  . Rhinitis  . Allergy  . H/O menorrhagia  . Hypokalemia   Current Outpatient Prescriptions on File Prior to Visit  Medication  Sig Dispense Refill  . azelastine (ASTELIN) 137 MCG/SPRAY nasal spray Place 2 sprays into the nose daily. Use in each nostril as directed       . DIOVAN HCT 160-25 MG per tablet Take 1 tablet by mouth Daily.      Marland Kitchen Linaclotide (LINZESS) 145 MCG CAPS Take by mouth daily.      . methocarbamol (ROBAXIN) 500 MG tablet Take 500 mg by mouth 4 (four) times daily.      . Ascorbic Acid (VITAMIN C PO) Take 1 tablet by mouth daily.      . Diethylpropion HCl 25 MG TABS Take by mouth.      . enoxaparin (LOVENOX) 40 MG/0.4ML injection Inject 40 mg into the skin daily.       No current facility-administered medications on file prior to visit.       Review of Systems See HPI    Objective:   Physical Exam Physical Exam  Nursing note and vitals reviewed.  Constitutional: She is oriented to person, place, and time. She appears well-developed and well-nourished.  HENT:  Head: Normocephalic and atraumatic.  Cardiovascular: Normal rate and regular rhythm. Exam reveals no gallop and no friction rub.  No murmur heard.  Pulmonary/Chest: Breath sounds normal. She has no wheezes. She has no rales.  Neurological: She is alert and oriented to person, place, and time.  Skin: Skin is warm and dry.  Psychiatric: She has a normal mood and affect. Her behavior is normal.             Assessment & Plan:  Hypokalemia    Will continue K for now. Hypokalemia may be due to diuretic.  She is to have repeat chemistries done on Monday 3/17/  (pt states it is free at work)     S/P breast reduction surgery  Anemia  Post surgical  Take OTC iron  See me in 6 weeks

## 2012-08-05 NOTE — Patient Instructions (Addendum)
See me in 6 weeks

## 2012-08-11 ENCOUNTER — Telehealth: Payer: Self-pay | Admitting: Internal Medicine

## 2012-08-11 NOTE — Telephone Encounter (Signed)
Pt states she had blood work done at work.. The nurse fax over result of the blood work to Dr Constance Goltz.. Pt would like a phone call with results and a discussion about what meds she will be put on to treat her potassium...pt called on 03.19.14 at 847 am... pls call her at [phone]

## 2012-08-12 ENCOUNTER — Telehealth: Payer: Self-pay | Admitting: Internal Medicine

## 2012-08-12 MED ORDER — INTEGRA F 125-1 MG PO CAPS: 1.0000 | ORAL_CAPSULE | Freq: Every day | ORAL | Status: DC

## 2012-08-12 NOTE — Telephone Encounter (Signed)
Bobbie   Call pt.  And let her know her K is fine but she is more anemic.  I will order Integra FE supplement to take one daily  She can come get samples from office    Make appt to see me next week

## 2012-08-12 NOTE — Telephone Encounter (Signed)
Pt calling again... She states her nurse at work her iron is low too... Pt and nurse would like to know what DDS is going to do about meds.. pls cll at [phone]

## 2012-08-12 NOTE — Telephone Encounter (Signed)
appt made for pt on 3/26 at 245

## 2012-08-16 ENCOUNTER — Encounter: Payer: Self-pay | Admitting: *Deleted

## 2012-08-18 ENCOUNTER — Ambulatory Visit (INDEPENDENT_AMBULATORY_CARE_PROVIDER_SITE_OTHER): Payer: PRIVATE HEALTH INSURANCE | Admitting: Internal Medicine

## 2012-08-18 ENCOUNTER — Encounter: Payer: Self-pay | Admitting: Internal Medicine

## 2012-08-18 VITALS — BP 123/77 | HR 88 | Temp 97.4°F | Resp 18 | Wt 162.0 lb

## 2012-08-18 DIAGNOSIS — I10 Essential (primary) hypertension: Secondary | ICD-10-CM

## 2012-08-18 DIAGNOSIS — D649 Anemia, unspecified: Secondary | ICD-10-CM

## 2012-08-18 DIAGNOSIS — E876 Hypokalemia: Secondary | ICD-10-CM

## 2012-08-18 LAB — BASIC METABOLIC PANEL
BUN: 16 mg/dL (ref 6–23)
CO2: 28 mEq/L (ref 19–32)
Chloride: 100 mEq/L (ref 96–112)
Creat: 0.59 mg/dL (ref 0.50–1.10)
Potassium: 3.1 mEq/L — ABNORMAL LOW (ref 3.5–5.3)

## 2012-08-18 MED ORDER — POTASSIUM CHLORIDE CRYS ER 10 MEQ PO TBCR: EXTENDED_RELEASE_TABLET | ORAL | Status: DC

## 2012-08-18 NOTE — Progress Notes (Addendum)
Subjective:    Patient ID: Samantha Horton, female    DOB: 1960/08/29, 52 y.o.   MRN: [id number]  HPI Laryssa is here for follow up.    See labs scanned form [facility] of 3/18.  She is taking her supplemental K  She is anemic  She is taking daily INtegra  She reports  A long history of anemia in the past.  2 years ago had enodmetrial ablation and has not had a menses since.    Recent colonoscopy by [name]  Allergies  Allergen Reactions  . Codeine Nausea And Vomiting  . Vicodin (Hydrocodone-Acetaminophen) Nausea And Vomiting   Past Medical History  Diagnosis Date  . Anemia   . Arthritis   . Headache     history of migraine per pt  . Allergy     allergic rhinitis  . Hypertension    Past Surgical History  Procedure Laterality Date  . Knee surgery  12/2006    Right  . Endometrial ablation    . Breast surgery     History   Social History  . Marital Status: Married    Spouse Name: N/A    Number of Children: 3  . Years of Education: HS grad   Occupational History  .    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 Smoking status: Never Smoker   . Smokeless tobacco: Never Used  . Alcohol Use: No  . Drug Use: No  . Sexually Active: Yes -- Female partner(s)    Birth Control/ Protection: Surgical   Other Topics Concern  . Not on file   Social History Narrative  . No narrative on file   Family History  Problem Relation Age of Onset  . Hypertension Mother   . Migraines Mother   . Arthritis Mother   . Hypertension Father    Patient Active Problem List  Diagnosis  . Essential hypertension, benign  . Arthritis  . Migraine  . Obesity  . Anemia  . Rhinitis  . Allergy  . H/O menorrhagia  . Hypokalemia   Current Outpatient Prescriptions on File Prior to Visit  Medication Sig Dispense Refill  . Ascorbic Acid (VITAMIN C PO) Take 1 tablet by mouth daily.      Marland Kitchen azelastine (ASTELIN) 137 MCG/SPRAY nasal spray Place 2 sprays into the nose daily. Use in each  nostril as directed       . Diethylpropion HCl 25 MG TABS Take by mouth.      . DIOVAN HCT 160-25 MG per tablet Take 1 tablet by mouth Daily.      . Fe Fum-FePoly-FA-Vit C-Vit B3 (INTEGRA F) 125-1 MG CAPS Take 1 capsule by mouth daily.  30 capsule  1  . Linaclotide (LINZESS) 145 MCG CAPS Take by mouth daily.      . methocarbamol (ROBAXIN) 500 MG tablet Take 500 mg by mouth 4 (four) times daily.       No current facility-administered medications on file prior to visit.      Review of Systems See HPI    Objective:   Physical Exam  Physical Exam  Nursing note and vitals reviewed.  Constitutional: She is oriented to person, place, and time. She appears well-developed and well-nourished.  HENT:  Head: Normocephalic and atraumatic.  Cardiovascular: Normal rate and regular rhythm. Exam reveals no gallop and no friction rub.  No murmur heard.  Pulmonary/Chest: Breath sounds normal. She has no wheezes. She has no rales.  Neurological: She  is alert and oriented to person, place, and time.  Skin: Skin is warm and dry.  Psychiatric: She has a normal mood and affect. Her behavior is normal.             Assessment & Plan:  HTN  Continue current meds  Hypokalemia  Will give low dose K-dur 10 meq as she cannot swallow larger sized pills.  Will reheck value today.  Her labs from  [facility] dated  08/10/2012 show a K of 4.0  Anemia .  Scanned labs from [facility] show  hgb 10.2   She is on integra.   Etiology  ??? Post-operative  Will rechek in 2-3 months  She reports she does have follow up with [name] for this as well.  She does have a long history of anemia per her report.  Cannot conclude menstrual loss as a contributor as shehas not had a menses in approx 2 years S/P enodmetrial ablation

## 2012-08-18 NOTE — Patient Instructions (Addendum)
See me in 2-3 months

## 2012-08-23 ENCOUNTER — Telehealth: Payer: Self-pay | Admitting: Internal Medicine

## 2012-08-23 NOTE — Telephone Encounter (Signed)
Left message for pt to call regarding K results   Advised to continue to take 20 meq for now

## 2012-08-24 ENCOUNTER — Telehealth: Payer: Self-pay | Admitting: Internal Medicine

## 2012-08-24 ENCOUNTER — Telehealth: Payer: Self-pay | Admitting: *Deleted

## 2012-08-24 NOTE — Telephone Encounter (Signed)
Labs mailed to pt home address.

## 2012-08-24 NOTE — Telephone Encounter (Signed)
Spoke with pt and informed of K level  She is to take 30 meq (3 10 meq tablets) daily for the next 3 days and then stay on 2 talbets daily   Will see pt in two weeks.  She voices understanding and will make appt in my office in two weeks

## 2012-08-24 NOTE — Telephone Encounter (Signed)
Pt states she is returning your phone call from yesterday... Pt called 04.01.14 at 933 am..Marland [employer]

## 2012-09-06 ENCOUNTER — Encounter: Payer: Self-pay | Admitting: *Deleted

## 2012-09-16 ENCOUNTER — Ambulatory Visit: Payer: PRIVATE HEALTH INSURANCE | Admitting: Internal Medicine

## 2012-09-16 ENCOUNTER — Emergency Department (HOSPITAL_BASED_OUTPATIENT_CLINIC_OR_DEPARTMENT_OTHER)
Admission: EM | Admit: 2012-09-16 | Discharge: 2012-09-17 | Disposition: A | Discharge status: Home / Self Care | Payer: PRIVATE HEALTH INSURANCE | Attending: Emergency Medicine | Admitting: Emergency Medicine

## 2012-09-16 DIAGNOSIS — Z8679 Personal history of other diseases of the circulatory system: Secondary | ICD-10-CM | POA: Insufficient documentation

## 2012-09-16 DIAGNOSIS — R197 Diarrhea, unspecified: Secondary | ICD-10-CM | POA: Insufficient documentation

## 2012-09-16 DIAGNOSIS — Z8739 Personal history of other diseases of the musculoskeletal system and connective tissue: Secondary | ICD-10-CM | POA: Insufficient documentation

## 2012-09-16 DIAGNOSIS — D649 Anemia, unspecified: Secondary | ICD-10-CM | POA: Insufficient documentation

## 2012-09-16 DIAGNOSIS — Z79899 Other long term (current) drug therapy: Secondary | ICD-10-CM | POA: Insufficient documentation

## 2012-09-16 DIAGNOSIS — Z862 Personal history of diseases of the blood and blood-forming organs and certain disorders involving the immune mechanism: Secondary | ICD-10-CM | POA: Insufficient documentation

## 2012-09-16 DIAGNOSIS — Z8709 Personal history of other diseases of the respiratory system: Secondary | ICD-10-CM | POA: Insufficient documentation

## 2012-09-16 DIAGNOSIS — R450 Nervousness: Secondary | ICD-10-CM | POA: Insufficient documentation

## 2012-09-16 DIAGNOSIS — I10 Essential (primary) hypertension: Secondary | ICD-10-CM | POA: Insufficient documentation

## 2012-09-16 DIAGNOSIS — R51 Headache: Secondary | ICD-10-CM | POA: Insufficient documentation

## 2012-09-16 DIAGNOSIS — IMO0001 Reserved for inherently not codable concepts without codable children: Secondary | ICD-10-CM

## 2012-09-16 DIAGNOSIS — Z8639 Personal history of other endocrine, nutritional and metabolic disease: Secondary | ICD-10-CM | POA: Insufficient documentation

## 2012-09-16 DIAGNOSIS — J3489 Other specified disorders of nose and nasal sinuses: Secondary | ICD-10-CM | POA: Insufficient documentation

## 2012-09-16 DIAGNOSIS — R569 Unspecified convulsions: Secondary | ICD-10-CM | POA: Insufficient documentation

## 2012-09-16 LAB — BASIC METABOLIC PANEL
BUN: 16 mg/dL (ref 6–23)
Creatinine, Ser: 0.6 mg/dL (ref 0.50–1.10)
GFR calc Af Amer: >90 mL/min (ref >90)
GFR calc non Af Amer: >90 mL/min (ref >90)
Glucose, Bld: 117 mg/dL — ABNORMAL HIGH (ref 70–99)

## 2012-09-16 NOTE — ED Notes (Signed)
Pt. Reports she woke tonight feeling "bad"  Pt. Reports she started shaking and felt she may have low potassium.  Pt. Is alert and oriented with no distress noted.

## 2012-09-17 LAB — URINALYSIS, ROUTINE W REFLEX MICROSCOPIC
Bilirubin Urine: NEGATIVE
Hgb urine dipstick: NEGATIVE
Ketones, ur: NEGATIVE mg/dL
Nitrite: NEGATIVE
Urobilinogen, UA: 0.2 mg/dL (ref 0.0–1.0)
pH: 7.5 (ref 5.0–8.0)

## 2012-09-17 LAB — URINE MICROSCOPIC-ADD ON

## 2012-09-17 NOTE — ED Provider Notes (Signed)
History     CSN: [id number]  Arrival date & time 09/16/12  2226   First MD Initiated Contact with Patient 09/17/12 0055      Chief Complaint  Patient presents with  . Shaking    (Consider location/radiation/quality/duration/timing/severity/associated sxs/prior treatment) HPI Is a 52 year old female who's had 2 days of sinus congestion and mild sinus headache. She was getting ready for bed just prior to arrival when she began shaking. She felt somewhat chilled at the time and mildly short of breath. Is accompanied by nervousness and a feeling of lack of energy. There was no chest pain. There was no palpitations. There was no nausea or vomiting. She is having diarrhea but she states as a side effect of the Linzess she takes. The symptoms lasted about 5 minutes, were moderate, and resolved on their own. She feels back to her baseline at this time apart from the nasal congestion the she has a history of hypokalemia.  Past Medical History  Diagnosis Date  . Anemia   . Arthritis   . Headache     history of migraine per pt  . Allergy     allergic rhinitis  . Hypertension     Past Surgical History  Procedure Laterality Date  . Knee surgery  12/2006    Right  . Endometrial ablation    . Breast surgery      Family History  Problem Relation Age of Onset  . Hypertension Mother   . Migraines Mother   . Arthritis Mother   . Hypertension Father     History  Substance Use Topics  . Smoking status: Never Smoker   . Smokeless tobacco: Never Used  . Alcohol Use: No    OB History   Grav Para Term Preterm Abortions TAB SAB Ect Mult Living                  Review of Systems  All other systems reviewed and are negative.    Allergies  Codeine and Vicodin  Home Medications   Current Outpatient Rx  Name  Route  Sig  Dispense  Refill  . Ascorbic Acid (VITAMIN C PO)   Oral   Take 1 tablet by mouth daily.         Marland Kitchen azelastine (ASTELIN) 137 MCG/SPRAY nasal spray    Nasal   Place 2 sprays into the nose daily. Use in each nostril as directed          . Diethylpropion HCl 25 MG TABS   Oral   Take by mouth.         . DIOVAN HCT 160-25 MG per tablet   Oral   Take 1 tablet by mouth Daily.         . Fe Fum-FePoly-FA-Vit C-Vit B3 (INTEGRA F) 125-1 MG CAPS   Oral   Take 1 capsule by mouth daily.   30 capsule   1   . Linaclotide (LINZESS) 145 MCG CAPS   Oral   Take by mouth daily.         . methocarbamol (ROBAXIN) 500 MG tablet   Oral   Take 500 mg by mouth 4 (four) times daily.         . potassium chloride (K-DUR,KLOR-CON) 10 MEQ tablet      Take one daily   90 tablet   1     BP 144/82  Pulse 109  Temp(Src) 97.7 F (36.5 C) (Oral)  Resp 16  Ht 5\' 1"  (1.549  m)  Wt 160 lb (72.576 kg)  BMI 30.25 kg/m2  SpO2 100%  Physical Exam General: Well-developed, well-nourished female in no acute distress; appearance consistent with age of record HENT: normocephalic, atraumatic Eyes: pupils equal round and reactive to light; extraocular muscles intact Neck: supple Heart: regular rate and rhythm Lungs: clear to auscultation bilaterally Abdomen: soft; nondistended; nontender; bowel sounds present Extremities: No deformity; full range of motion; pulses normal Neurologic: Awake, alert and oriented; motor function intact in all extremities and symmetric; no facial droop Skin: Warm and dry Psychiatric: Normal mood and affect    ED Course  Procedures (including critical care time)     MDM   Nursing notes and vitals signs, including pulse oximetry, reviewed.  Summary of this visit's results, reviewed by myself:  Results for orders placed during the hospital encounter of 09/16/12  BASIC METABOLIC PANEL      Result Value Range   Sodium 141  135 - 145 mEq/L   Potassium 3.6  3.5 - 5.1 mEq/L   Chloride 102  96 - 112 mEq/L   CO2 31  19 - 32 mEq/L   Glucose, Bld 117 (*) 70 - 99 mg/dL   BUN 16  6 - 23 mg/dL   Creatinine, Ser  1.61  0.50 - 1.10 mg/dL   Calcium 9.4  8.4 - 09.6 mg/dL   GFR calc non Af Amer >90  >90 mL/min   GFR calc Af Amer >90  >90 mL/min  URINALYSIS, ROUTINE W REFLEX MICROSCOPIC      Result Value Range   Color, Urine YELLOW  YELLOW   APPearance CLEAR  CLEAR   Specific Gravity, Urine 1.011  1.005 - 1.030   pH 7.5  5.0 - 8.0   Glucose, UA NEGATIVE  NEGATIVE mg/dL   Hgb urine dipstick NEGATIVE  NEGATIVE   Bilirubin Urine NEGATIVE  NEGATIVE   Ketones, ur NEGATIVE  NEGATIVE mg/dL   Protein, ur NEGATIVE  NEGATIVE mg/dL   Urobilinogen, UA 0.2  0.0 - 1.0 mg/dL   Nitrite NEGATIVE  NEGATIVE   Leukocytes, UA TRACE (*) NEGATIVE  URINE MICROSCOPIC-ADD ON      Result Value Range   Squamous Epithelial / LPF RARE  RARE   WBC, UA 0-2  <3 WBC/hpf   RBC / HPF 0-2  <3 RBC/hpf   Bacteria, UA RARE  RARE   1:06 AM The patient was advised that the cause of her earlier symptoms are not evident on exam. Her potassium is within normal limits and she is not febrile. She was advised to return should symptoms worsen or change.          Hanley Seamen, MD 09/17/12 [phone]

## 2012-09-23 ENCOUNTER — Ambulatory Visit (INDEPENDENT_AMBULATORY_CARE_PROVIDER_SITE_OTHER): Payer: PRIVATE HEALTH INSURANCE | Admitting: Internal Medicine

## 2012-09-23 ENCOUNTER — Encounter: Payer: Self-pay | Admitting: Internal Medicine

## 2012-09-23 VITALS — BP 126/84 | HR 84 | Temp 97.6°F | Resp 18 | Wt 165.0 lb

## 2012-09-23 DIAGNOSIS — D649 Anemia, unspecified: Secondary | ICD-10-CM

## 2012-09-23 DIAGNOSIS — E876 Hypokalemia: Secondary | ICD-10-CM

## 2012-09-23 LAB — BASIC METABOLIC PANEL
BUN: 14 mg/dL (ref 6–23)
CO2: 30 mEq/L (ref 19–32)
Calcium: 9.6 mg/dL (ref 8.4–10.5)
Creat: 0.67 mg/dL (ref 0.50–1.10)

## 2012-09-23 MED ORDER — POTASSIUM CHLORIDE CRYS ER 10 MEQ PO TBCR: EXTENDED_RELEASE_TABLET | ORAL | Status: DC

## 2012-09-23 NOTE — Patient Instructions (Addendum)
See me in 4-6 weeks

## 2012-09-23 NOTE — Progress Notes (Signed)
Subjective:    Patient ID: Samantha Horton, female    DOB: November 06, 1960, 52 y.o.   MRN: [id number]  HPI  Samantha Horton is here for follow up on 2 issues.  She has been on Integra for anemia which she describes as chronic for her and there may have been a component of postoperative anemia.  Recent labs at [facility] show improvement with HGB of 11.5  Her K is corrected on low dose 10 meq as she cannot tolerate larger sized pills.  She takes two tablets daily  She was seen in ER 1 week ago for "shaking spell" and feeling of syncope.  She tells me she was trying to move bowels and stood up and felt like she was going to pass out.  She did not lose consciousness.  Episode resolved by the time she was in ER  Allergies  Allergen Reactions  . Codeine Nausea And Vomiting  . Vicodin (Hydrocodone-Acetaminophen) Nausea And Vomiting   Past Medical History  Diagnosis Date  . Anemia   . Arthritis   . Headache     history of migraine per pt  . Allergy     allergic rhinitis  . Hypertension    Past Surgical History  Procedure Laterality Date  . Knee surgery  12/2006    Right  . Endometrial ablation    . Breast surgery     History   Social History  . Marital Status: Married    Spouse Name: N/A    Number of Children: 3  . Years of Education: HS grad   Occupational History  .    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 Smoking status: Never Smoker   . Smokeless tobacco: Never Used  . Alcohol Use: No  . Drug Use: No  . Sexually Active: Yes -- Female partner(s)    Birth Control/ Protection: Surgical   Other Topics Concern  . Not on file   Social History Narrative  . No narrative on file   Family History  Problem Relation Age of Onset  . Hypertension Mother   . Migraines Mother   . Arthritis Mother   . Hypertension Father    Patient Active Problem List   Diagnosis Date Noted  . Hypokalemia 08/05/2012  . H/O menorrhagia 02/26/2012  . Essential hypertension, benign 12/09/2010   . Arthritis 12/09/2010  . Migraine 12/09/2010  . Obesity 12/09/2010  . Anemia 12/09/2010  . Rhinitis 12/09/2010  . Allergy    Current Outpatient Prescriptions on File Prior to Visit  Medication Sig Dispense Refill  . azelastine (ASTELIN) 137 MCG/SPRAY nasal spray Place 2 sprays into the nose daily. Use in each nostril as directed       . DIOVAN HCT 160-25 MG per tablet Take 1 tablet by mouth Daily.      . Fe Fum-FePoly-FA-Vit C-Vit B3 (INTEGRA F) 125-1 MG CAPS Take 1 capsule by mouth daily.  30 capsule  1  . potassium chloride (K-DUR,KLOR-CON) 10 MEQ tablet Take one daily  90 tablet  1  . Linaclotide (LINZESS) 145 MCG CAPS Take by mouth daily.       No current facility-administered medications on file prior to visit.     Review of Systems See HPI     Objective:   Physical Exam  Physical Exam  Nursing note and vitals reviewed.  Constitutional: She is oriented to person, place, and time. She appears well-developed and well-nourished.  HENT:  Head: Normocephalic and atraumatic.  Cardiovascular:  Normal rate and regular rhythm. Exam reveals no gallop and no friction rub.  No murmur heard.  Pulmonary/Chest: Breath sounds normal. She has no wheezes. She has no rales.  Neurological: She is alert and oriented to person, place, and time.  Skin: Skin is warm and dry.  Psychiatric: She has a normal mood and affect. Her behavior is normal.             Assessment & Plan:  Microcytic anemia  Now normalized will continue Integra   Hypokalemia continue K-dur  Two 10 meq tablets  ER follow up for "shaking spell"  ?? eitology    Possible defecation near syncope  See me in 4-6 weeks

## 2012-09-27 ENCOUNTER — Encounter: Payer: Self-pay | Admitting: *Deleted

## 2012-09-28 ENCOUNTER — Encounter: Payer: Self-pay | Admitting: *Deleted

## 2012-09-30 ENCOUNTER — Telehealth: Payer: Self-pay | Admitting: *Deleted

## 2012-09-30 NOTE — Telephone Encounter (Signed)
error 

## 2012-10-21 ENCOUNTER — Ambulatory Visit (INDEPENDENT_AMBULATORY_CARE_PROVIDER_SITE_OTHER): Payer: PRIVATE HEALTH INSURANCE | Admitting: Internal Medicine

## 2012-10-21 ENCOUNTER — Encounter: Payer: Self-pay | Admitting: Internal Medicine

## 2012-10-21 VITALS — BP 130/86 | HR 95 | Temp 98.3°F | Resp 16 | Wt 167.0 lb

## 2012-10-21 DIAGNOSIS — E669 Obesity, unspecified: Secondary | ICD-10-CM

## 2012-10-21 DIAGNOSIS — E876 Hypokalemia: Secondary | ICD-10-CM

## 2012-10-21 DIAGNOSIS — I10 Essential (primary) hypertension: Secondary | ICD-10-CM

## 2012-10-21 DIAGNOSIS — J310 Chronic rhinitis: Secondary | ICD-10-CM

## 2012-10-21 NOTE — Patient Instructions (Signed)
See me in 3 months

## 2012-10-21 NOTE — Progress Notes (Signed)
Subjective:    Patient ID: Samantha Horton, female    DOB: [birth year], 52 y.o.   MRN: [id number]  HPI Samantha Horton is here for follow up.   She has not had anymore episodes of near syncope  See labs scanned from [facility] done 5/27  K is normal  Hgb stable at 11.  Lipids look great  She tells me she is now on Phentermine from [name]  She does not increasing nasal stuffiness.  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 Codeine Nausea And Vomiting  . Vicodin (Hydrocodone-Acetaminophen) Nausea And Vomiting   Past Medical History  Diagnosis Date  . Anemia   . Arthritis   . Headache(784.0)     history of migraine per pt  . Allergy     allergic rhinitis  . Hypertension    Past Surgical History  Procedure Laterality Date  . Knee surgery  12/2006    Right  . Endometrial ablation    . Breast surgery     History   Social History  . Marital Status: Married    Spouse Name: N/A    Number of Children: 3  . Years of Education: HS grad   Occupational History  .    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 Smoking status: Never Smoker   . Smokeless tobacco: Never Used  . Alcohol Use: No  . Drug Use: No  . Sexually Active: Yes -- Female partner(s)    Birth Control/ Protection: Surgical   Other Topics Concern  . Not on file   Social History Narrative  . No narrative on file   Family History  Problem Relation Age of Onset  . Hypertension Mother   . Migraines Mother   . Arthritis Mother   . Hypertension Father    Patient Active Problem List   Diagnosis Date Noted  . Hypokalemia 08/05/2012  . H/O menorrhagia 02/26/2012  . Essential hypertension, benign 12/09/2010  . Arthritis 12/09/2010  . Migraine 12/09/2010  . Obesity 12/09/2010  . Anemia 12/09/2010  . Rhinitis 12/09/2010  . Allergy    Current Outpatient Prescriptions on File Prior to Visit  Medication Sig Dispense Refill  .  azelastine (ASTELIN) 137 MCG/SPRAY nasal spray Place 2 sprays into the nose daily. Use in each nostril as directed       . DIOVAN HCT 160-25 MG per tablet Take 1 tablet by mouth Daily.      . Fe Fum-FePoly-FA-Vit C-Vit B3 (INTEGRA F) 125-1 MG CAPS Take 1 capsule by mouth daily.  30 capsule  1  . Linaclotide (LINZESS) 145 MCG CAPS Take by mouth daily.      . potassium chloride (K-DUR,KLOR-CON) 10 MEQ tablet Take two tablets daily  90 tablet  1   No current facility-administered medications on file prior to visit.       Review of Systems    see HPI Objective:   Physical Exam Physical Exam  Nursing note and vitals reviewed. See repeat BP Constitutional: She is oriented to person, place, and time. She appears well-developed and well-nourished.  HENT:  Head: Normocephalic and atraumatic.  Cardiovascular: Normal rate and regular rhythm. Exam reveals no gallop and no friction rub.  No murmur heard.  Pulmonary/Chest: Breath sounds normal. She has no wheezes. She has no rales.  Neurological: She is alert and oriented to person, place, and time.  Skin: Skin is  warm and dry.  Psychiatric: She has a normal mood and affect. Her behavior is normal.             Assessment & Plan:  HTN  Continue current meds  Hypokalemia  Continue 20 meq daily  Near syncope  No further episodes  Allergic rhiniits  OK for Allegra.  Counseled not to take decongestant since she is on Phentermine

## 2012-10-22 ENCOUNTER — Encounter: Payer: Self-pay | Admitting: *Deleted

## 2012-11-02 ENCOUNTER — Encounter: Payer: Self-pay | Admitting: *Deleted

## 2012-12-01 ENCOUNTER — Ambulatory Visit: Payer: PRIVATE HEALTH INSURANCE | Admitting: Family Medicine

## 2013-01-20 ENCOUNTER — Ambulatory Visit: Payer: PRIVATE HEALTH INSURANCE | Admitting: Internal Medicine

## 2013-01-28 ENCOUNTER — Ambulatory Visit: Payer: PRIVATE HEALTH INSURANCE | Admitting: Family Medicine

## 2013-01-31 ENCOUNTER — Ambulatory Visit (INDEPENDENT_AMBULATORY_CARE_PROVIDER_SITE_OTHER): Payer: PRIVATE HEALTH INSURANCE | Admitting: Internal Medicine

## 2013-01-31 ENCOUNTER — Other Ambulatory Visit: Payer: Self-pay | Admitting: Internal Medicine

## 2013-01-31 ENCOUNTER — Encounter: Payer: Self-pay | Admitting: Internal Medicine

## 2013-01-31 VITALS — BP 138/88 | HR 91 | Temp 97.4°F | Resp 18 | Wt 160.0 lb

## 2013-01-31 DIAGNOSIS — D649 Anemia, unspecified: Secondary | ICD-10-CM

## 2013-01-31 DIAGNOSIS — E876 Hypokalemia: Secondary | ICD-10-CM

## 2013-01-31 DIAGNOSIS — I10 Essential (primary) hypertension: Secondary | ICD-10-CM

## 2013-01-31 DIAGNOSIS — N898 Other specified noninflammatory disorders of vagina: Secondary | ICD-10-CM

## 2013-01-31 DIAGNOSIS — N939 Abnormal uterine and vaginal bleeding, unspecified: Secondary | ICD-10-CM | POA: Insufficient documentation

## 2013-01-31 LAB — CBC
Hemoglobin: 11 g/dL — ABNORMAL LOW (ref 12.0–15.0)
MCHC: 31.9 g/dL (ref 30.0–36.0)
Platelets: 495 10*3/uL — ABNORMAL HIGH (ref 150–400)
RDW: 16.7 % — ABNORMAL HIGH (ref 11.5–15.5)

## 2013-01-31 LAB — BASIC METABOLIC PANEL
Glucose, Bld: 80 mg/dL (ref 70–99)
Potassium: 3.7 mEq/L (ref 3.5–5.3)
Sodium: 139 mEq/L (ref 135–145)

## 2013-01-31 NOTE — Progress Notes (Signed)
Subjective:    Patient ID: Samantha Horton, female    DOB: 1960-07-14, 52 y.o.   MRN: [id number]  HPI  Samantha Horton  Is here for follow up  She has lost 7 lbs since her last visit.     She reports she has been on Phentermine for about 1 year now.  She has appt with Dr. Kinnie Horton next week.  She tells me she is at her personal goal weight although her BMI is still 30  See BP  No visual changes no headache  She is asymptomatic  She reports she has had some recent vaginal spotting.  She is S/P endometrial ablation (Dr. Neva Horton).  She tells me she has an upcoming appt with Dr. Neva Horton to discuss this.    Allergies  Allergen Reactions  . Codeine Nausea And Vomiting  . Vicodin [Hydrocodone-Acetaminophen] Nausea And Vomiting   Past Medical History  Diagnosis Date  . Anemia   . Arthritis   . Headache(784.0)     history of migraine per pt  . Allergy     allergic rhinitis  . Hypertension    Past Surgical History  Procedure Laterality Date  . Knee surgery  12/2006    Right  . Endometrial ablation    . Breast surgery     History   Social History  . Marital Status: Married    Spouse Name: N/A    Number of Children: 3  . Years of Education: HS grad   Occupational History  .    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 Smoking status: Never Smoker   . Smokeless tobacco: Never Used  . Alcohol Use: No  . Drug Use: No  . Sexual Activity: Yes    Partners: Male    Birth Control/ Protection: Surgical   Other Topics Concern  . Not on file   Social History Narrative  . No narrative on file   Family History  Problem Relation Age of Onset  . Hypertension Mother   . Migraines Mother   . Arthritis Mother   . Hypertension Father    Patient Active Problem List   Diagnosis Date Noted  . Vaginal spotting 01/31/2013  . Hypokalemia 08/05/2012  . H/O menorrhagia 02/26/2012  . Essential hypertension, benign 12/09/2010  . Arthritis 12/09/2010  . Migraine 12/09/2010  . Obesity  12/09/2010  . Anemia 12/09/2010  . Rhinitis 12/09/2010  . Allergy    Current Outpatient Prescriptions on File Prior to Visit  Medication Sig Dispense Refill  . azelastine (ASTELIN) 137 MCG/SPRAY nasal spray Place 2 sprays into the nose daily. Use in each nostril as directed       . DIOVAN HCT 160-25 MG per tablet Take 1 tablet by mouth Daily.      . Fe Fum-FePoly-FA-Vit C-Vit B3 (INTEGRA F) 125-1 MG CAPS Take 1 capsule by mouth daily.  30 capsule  1  . Linaclotide (LINZESS) 145 MCG CAPS Take by mouth daily.      . phentermine (ADIPEX-P) 37.5 MG tablet Take 37.5 mg by mouth daily before breakfast.      . potassium chloride (K-DUR,KLOR-CON) 10 MEQ tablet Take two tablets daily  90 tablet  1   No current facility-administered medications on file prior to visit.     Review of Systems    see HPi Objective:   Physical Exam Physical Exam  Nursing note and vitals reviewed.   Repeat BP  136/84 my exam Constitutional: She is oriented to person,  place, and time. She appears well-developed and well-nourished.  HENT:  Head: Normocephalic and atraumatic.  Cardiovascular: Normal rate and regular rhythm. Exam reveals no gallop and no friction rub.  No murmur heard.  Pulmonary/Chest: Breath sounds normal. She has no wheezes. She has no rales.  Neurological: She is alert and oriented to person, place, and time.  Skin: Skin is warm and dry.  Psychiatric: She has a normal mood and affect. Her behavior is normal.         Assessment & Plan:  HTN:  Pt tells me her weight is at goal.  I advised it is time to come off phentermine as this may be contributing to elevated pressures at times.  Repeat BP OK by me exam.  - she is  advised to taper phentermine to qod until her visit next week with Dr. Kinnie Horton.  I did not change her BP med.   Will check BMP today  Overweight.  She tells me she is eating a 1200 calorie diet  BMI  30  Vaginal spotting post endometrial ablation  She has follow up with her  GYN  History of anemia   Slight improvemen of  HGB 11.2 on 8/25.  She is still on her  Integra supplements.  She reports she did have her screening colonoscopy with Dr. Kinnie Horton  2012.  Will check labs today Advised to call for influenza vaccine at end of september

## 2013-02-01 ENCOUNTER — Encounter: Payer: Self-pay | Admitting: *Deleted

## 2013-02-03 ENCOUNTER — Other Ambulatory Visit: Payer: Self-pay | Admitting: *Deleted

## 2013-02-03 NOTE — Telephone Encounter (Signed)
Pt called labs resulted needs K+ refilled

## 2013-02-06 MED ORDER — POTASSIUM CHLORIDE CRYS ER 10 MEQ PO TBCR: EXTENDED_RELEASE_TABLET | ORAL | Status: DC

## 2013-03-11 ENCOUNTER — Ambulatory Visit (INDEPENDENT_AMBULATORY_CARE_PROVIDER_SITE_OTHER): Payer: PRIVATE HEALTH INSURANCE | Admitting: *Deleted

## 2013-03-11 DIAGNOSIS — Z23 Encounter for immunization: Secondary | ICD-10-CM

## 2013-04-06 ENCOUNTER — Other Ambulatory Visit: Payer: Self-pay | Admitting: Internal Medicine

## 2013-04-06 NOTE — Telephone Encounter (Signed)
Refill request

## 2013-04-07 ENCOUNTER — Other Ambulatory Visit: Payer: Self-pay | Admitting: *Deleted

## 2013-04-07 MED ORDER — POTASSIUM CHLORIDE CRYS ER 10 MEQ PO TBCR: EXTENDED_RELEASE_TABLET | ORAL | Status: DC

## 2013-04-07 NOTE — Telephone Encounter (Signed)
Refill request

## 2013-07-28 ENCOUNTER — Telehealth: Payer: Self-pay | Admitting: *Deleted

## 2013-08-01 ENCOUNTER — Ambulatory Visit (INDEPENDENT_AMBULATORY_CARE_PROVIDER_SITE_OTHER): Payer: PRIVATE HEALTH INSURANCE | Admitting: Internal Medicine

## 2013-08-01 ENCOUNTER — Encounter: Payer: Self-pay | Admitting: Internal Medicine

## 2013-08-01 VITALS — BP 127/79 | HR 97 | Temp 97.9°F | Resp 18 | Wt 161.0 lb

## 2013-08-01 DIAGNOSIS — F411 Generalized anxiety disorder: Secondary | ICD-10-CM

## 2013-08-01 DIAGNOSIS — IMO0001 Reserved for inherently not codable concepts without codable children: Secondary | ICD-10-CM

## 2013-08-01 DIAGNOSIS — D649 Anemia, unspecified: Secondary | ICD-10-CM

## 2013-08-01 DIAGNOSIS — E663 Overweight: Secondary | ICD-10-CM

## 2013-08-01 DIAGNOSIS — F419 Anxiety disorder, unspecified: Secondary | ICD-10-CM

## 2013-08-01 DIAGNOSIS — R030 Elevated blood-pressure reading, without diagnosis of hypertension: Secondary | ICD-10-CM

## 2013-08-01 NOTE — Progress Notes (Signed)
Subjective:    Patient ID: Samantha Horton, female    DOB: 10-Apr-1961, 53 y.o.   MRN: [id number]  HPI  Samantha Horton is here for follow up of elevated BP -  Since last visit she has come off her phentermine and doing well.    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   Causing extreme anxiety and she went to [facility] urgent care .  Began on Lexapro which she has taken for the last 3 months.  Feeling much better with Lexapro.   Weight stable off appetite suppressants  She has seen her GYN for her vaginal spotting  Allergies  Allergen Reactions  . Codeine Nausea And Vomiting  . Vicodin [Hydrocodone-Acetaminophen] Nausea And Vomiting   Past Medical History  Diagnosis Date  . Anemia   . Arthritis   . Headache(784.0)     history of migraine per pt  . Allergy     allergic rhinitis  . Hypertension   . Anxiety    Past Surgical History  Procedure Laterality Date  . Knee surgery  12/2006    Right  . Endometrial ablation    . Breast surgery     History   Social History  . Marital Status: Married    Spouse Name: N/A    Number of Children: 3  . Years of Education: HS grad   Occupational History  .    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 Smoking status: Never Smoker   . Smokeless tobacco: Never Used  . Alcohol Use: No  . Drug Use: No  . Sexual Activity: Yes    Partners: Male    Birth Control/ Protection: Surgical   Other Topics Concern  . Not on file   Social History Narrative  . No narrative on file   Family History  Problem Relation Age of Onset  . Hypertension Mother   . Migraines Mother   . Arthritis Mother   . Hypertension Father    Patient Active Problem List   Diagnosis Date Noted  . Vaginal spotting 01/31/2013  . Hypokalemia 08/05/2012  . H/O menorrhagia 02/26/2012  . Essential hypertension, benign 12/09/2010  . Arthritis 12/09/2010  . Migraine 12/09/2010  . Obesity 12/09/2010  . Anemia 12/09/2010  .  Rhinitis 12/09/2010  . Allergy    Current Outpatient Prescriptions on File Prior to Visit  Medication Sig Dispense Refill  . azelastine (ASTELIN) 137 MCG/SPRAY nasal spray Place 2 sprays into the nose daily. Use in each nostril as directed       . DIOVAN HCT 160-25 MG per tablet Take 1 tablet by mouth Daily.      . Fe Fum-FePoly-FA-Vit C-Vit B3 (INTEGRA F) 125-1 MG CAPS Take 1 capsule by mouth daily.  30 capsule  1  . Linaclotide (LINZESS) 145 MCG CAPS Take by mouth daily.      . potassium chloride (K-DUR,KLOR-CON) 10 MEQ tablet Take two tablets daily  90 tablet  1   No current facility-administered medications on file prior to visit.     Review of Systems  See HPI Objective:   Physical Exam  Physical Exam  Nursing note and vitals reviewed.   Repeat BP  126/76 Constitutional: She is oriented to person, place, and time. She appears well-developed and well-nourished.  HENT:  Head: Normocephalic and atraumatic.  Cardiovascular: Normal rate and regular rhythm. Exam reveals no gallop and no friction rub.  No murmur heard.  Pulmonary/Chest: Breath sounds normal. She has no wheezes. She has no rales.  Neurological: She is alert and oriented to person, place, and time.  Ext:  Ne edema Skin: Skin is warm and dry.  Psychiatric: She has a normal mood and affect. Her behavior is normal.        Assessment & Plan:  Elevated BP  Now normal off stimulants  Anxiety  :  Continue Lexapro  Anemia   Recent labs at [facility]  "good" per pt report  See me in 6 months or prn

## 2013-08-01 NOTE — Patient Instructions (Signed)
Schedule CPE with me and return in 6 months

## 2013-08-03 ENCOUNTER — Telehealth: Payer: Self-pay | Admitting: *Deleted

## 2013-08-19 NOTE — Telephone Encounter (Signed)
Error

## 2013-08-19 NOTE — Telephone Encounter (Signed)
appt made

## 2014-02-01 ENCOUNTER — Other Ambulatory Visit: Payer: Self-pay | Admitting: *Deleted

## 2014-02-01 DIAGNOSIS — Z0000 Encounter for general adult medical examination without abnormal findings: Secondary | ICD-10-CM

## 2014-02-02 ENCOUNTER — Encounter: Payer: PRIVATE HEALTH INSURANCE | Admitting: Internal Medicine

## 2014-02-02 DIAGNOSIS — Z0000 Encounter for general adult medical examination without abnormal findings: Secondary | ICD-10-CM

## 2014-04-12 ENCOUNTER — Encounter (HOSPITAL_BASED_OUTPATIENT_CLINIC_OR_DEPARTMENT_OTHER): Payer: Self-pay

## 2014-04-12 ENCOUNTER — Inpatient Hospital Stay (HOSPITAL_BASED_OUTPATIENT_CLINIC_OR_DEPARTMENT_OTHER)
Admission: EM | Admit: 2014-04-12 | Discharge: 2014-04-18 | DRG: 419 | Disposition: A | Discharge status: Home / Self Care | Payer: Commercial Managed Care - PPO | Attending: Surgery | Admitting: Surgery

## 2014-04-12 ENCOUNTER — Emergency Department (HOSPITAL_BASED_OUTPATIENT_CLINIC_OR_DEPARTMENT_OTHER): Payer: Commercial Managed Care - PPO

## 2014-04-12 DIAGNOSIS — F419 Anxiety disorder, unspecified: Secondary | ICD-10-CM | POA: Diagnosis present

## 2014-04-12 DIAGNOSIS — Z419 Encounter for procedure for purposes other than remedying health state, unspecified: Secondary | ICD-10-CM

## 2014-04-12 DIAGNOSIS — R109 Unspecified abdominal pain: Secondary | ICD-10-CM

## 2014-04-12 DIAGNOSIS — K8000 Calculus of gallbladder with acute cholecystitis without obstruction: Principal | ICD-10-CM | POA: Diagnosis present

## 2014-04-12 DIAGNOSIS — I10 Essential (primary) hypertension: Secondary | ICD-10-CM | POA: Diagnosis present

## 2014-04-12 DIAGNOSIS — K819 Cholecystitis, unspecified: Secondary | ICD-10-CM | POA: Diagnosis present

## 2014-04-12 DIAGNOSIS — K8001 Calculus of gallbladder with acute cholecystitis with obstruction: Secondary | ICD-10-CM

## 2014-04-12 DIAGNOSIS — K810 Acute cholecystitis: Secondary | ICD-10-CM

## 2014-04-12 LAB — CBC WITH DIFFERENTIAL/PLATELET
BASOS ABS: 0 10*3/uL (ref 0.0–0.1)
Basophils Relative: 0 % (ref 0–1)
EOS ABS: 0.1 10*3/uL (ref 0.0–0.7)
Eosinophils Relative: 1 % (ref 0–5)
HCT: 32.7 % — ABNORMAL LOW (ref 36.0–46.0)
Hemoglobin: 10.9 g/dL — ABNORMAL LOW (ref 12.0–15.0)
Lymphocytes Relative: 7 % — ABNORMAL LOW (ref 12–46)
Lymphs Abs: 0.9 10*3/uL (ref 0.7–4.0)
MCH: 22.3 pg — AB (ref 26.0–34.0)
MCHC: 33.3 g/dL (ref 30.0–36.0)
MCV: 67 fL — ABNORMAL LOW (ref 78.0–100.0)
MONO ABS: 1 10*3/uL (ref 0.1–1.0)
Monocytes Relative: 8 % (ref 3–12)
NEUTROS PCT: 84 % — AB (ref 43–77)
Neutro Abs: 10.6 10*3/uL — ABNORMAL HIGH (ref 1.7–7.7)
PLATELETS: 338 10*3/uL (ref 150–400)
RBC: 4.88 MIL/uL (ref 3.87–5.11)
RDW: 15.4 % (ref 11.5–15.5)
WBC: 12.6 10*3/uL — ABNORMAL HIGH (ref 4.0–10.5)

## 2014-04-12 LAB — URINALYSIS, ROUTINE W REFLEX MICROSCOPIC
Bilirubin Urine: NEGATIVE
Glucose, UA: NEGATIVE mg/dL
HGB URINE DIPSTICK: NEGATIVE
Ketones, ur: 15 mg/dL — AB
NITRITE: NEGATIVE
Protein, ur: NEGATIVE mg/dL
SPECIFIC GRAVITY, URINE: 1.022 (ref 1.005–1.030)
UROBILINOGEN UA: 4 mg/dL — AB (ref 0.0–1.0)
pH: 7 (ref 5.0–8.0)

## 2014-04-12 LAB — URINE MICROSCOPIC-ADD ON

## 2014-04-12 LAB — COMPREHENSIVE METABOLIC PANEL
ALBUMIN: 3.9 g/dL (ref 3.5–5.2)
ALK PHOS: 136 U/L — AB (ref 39–117)
ALT: 38 U/L — AB (ref 0–35)
ANION GAP: 15 (ref 5–15)
AST: 38 U/L — AB (ref 0–37)
BUN: 7 mg/dL (ref 6–23)
CO2: 26 mEq/L (ref 19–32)
Calcium: 9.3 mg/dL (ref 8.4–10.5)
Chloride: 94 mEq/L — ABNORMAL LOW (ref 96–112)
Creatinine, Ser: 0.5 mg/dL (ref 0.50–1.10)
GFR calc Af Amer: >90 mL/min (ref >90)
GFR calc non Af Amer: >90 mL/min (ref >90)
Glucose, Bld: 138 mg/dL — ABNORMAL HIGH (ref 70–99)
POTASSIUM: 3.5 meq/L — AB (ref 3.7–5.3)
SODIUM: 135 meq/L — AB (ref 137–147)
TOTAL PROTEIN: 8 g/dL (ref 6.0–8.3)
Total Bilirubin: 0.9 mg/dL (ref 0.3–1.2)

## 2014-04-12 LAB — LIPASE, BLOOD: Lipase: 24 U/L (ref 11–59)

## 2014-04-12 MED ORDER — MORPHINE SULFATE 4 MG/ML IJ SOLN
4.0000 mg | Freq: Once | INTRAMUSCULAR | Status: AC
  Administered 2014-04-12: 4 mg via INTRAVENOUS
  Filled 2014-04-12: qty 1

## 2014-04-12 MED ORDER — ONDANSETRON HCL 4 MG/2ML IJ SOLN
4.0000 mg | Freq: Once | INTRAMUSCULAR | Status: AC
  Administered 2014-04-12: 4 mg via INTRAVENOUS
  Filled 2014-04-12: qty 2

## 2014-04-12 NOTE — ED Notes (Addendum)
abd pain, nausea started yesterday-was seen by PCP today-states ? GB with outpt UD to be done with rx phenergan and bentyl

## 2014-04-12 NOTE — ED Provider Notes (Signed)
CSN: [id number]     Arrival date & time 04/12/14  2210 History  This chart was scribed for Samantha Hacker, MD by Tula Nakayama, ED Scribe. This patient was seen in room MH07/MH07 and the patient's care was started at 11:12 PM.    Chief Complaint  Patient presents with  . Abdominal Pain   The history is provided by the patient. No language interpreter was used.   HPI Comments: Samantha Horton is a 53 y.o. female who presents to the Emergency Department complaining of constant, cramping abdominal pain that started yesterday. She notes nausea as an associated symptom.   Worse with food. Current pain is "20 out of 10." It is nonradiating.  Pt went to PCP today and was prescribed Phenergan and Bentyl with no relief. She was told to return for [place] on in 2 days. Pt denies history of similar symptoms or EtOH use. Her last BM was this morning and was normal. Pt denies vomiting and fever as associated symptoms.   Past Medical History  Diagnosis Date  . Anemia   . Arthritis   . Headache(784.0)     history of migraine per pt  . Allergy     allergic rhinitis  . Hypertension   . Anxiety    Past Surgical History  Procedure Laterality Date  . Knee surgery  12/2006    Right  . Endometrial ablation    . Breast surgery     Family History  Problem Relation Age of Onset  . Hypertension Mother   . Migraines Mother   . Arthritis Mother   . Hypertension Father    History  Substance Use Topics  . Smoking status: Never Smoker   . Smokeless tobacco: Never Used  . Alcohol Use: No   OB History    No data available     Review of Systems  Constitutional: Negative for fever.  Respiratory: Negative for cough, chest tightness and shortness of breath.   Cardiovascular: Negative for chest pain.  Gastrointestinal: Positive for nausea and abdominal pain. Negative for vomiting, diarrhea and constipation.  Musculoskeletal: Negative for back pain.  Neurological: Negative for headaches.   Psychiatric/Behavioral: Negative for confusion.  All other systems reviewed and are negative.     Allergies  Codeine and Vicodin  Home Medications   Prior to Admission medications   Medication Sig Start Date End Date Taking? Authorizing Provider  dicyclomine (BENTYL) 20 MG tablet Take 20 mg by mouth every 6 (six) hours.   Yes Historical Provider, MD  promethazine (PHENERGAN) 25 MG tablet Take 25 mg by mouth every 6 (six) hours as needed for nausea or vomiting.   Yes Historical Provider, MD  azelastine (ASTELIN) 137 MCG/SPRAY nasal spray Place 2 sprays into the nose daily. Use in each nostril as directed     Historical Provider, MD  DIOVAN HCT 160-25 MG per tablet Take 1 tablet by mouth Daily. 12/07/10   Historical Provider, MD  escitalopram (LEXAPRO) 10 MG tablet Take 10 mg by mouth daily.    Historical Provider, MD  Fe Fum-FePoly-FA-Vit C-Vit B3 (INTEGRA F) 125-1 MG CAPS Take 1 capsule by mouth daily. 08/12/12   Lanice Shirts, MD  Linaclotide Rolan Lipa) 145 MCG CAPS Take by mouth daily.    Historical Provider, MD  potassium chloride (K-DUR,KLOR-CON) 10 MEQ tablet Take two tablets daily 04/07/13   Lanice Shirts, MD   BP 156/78 mmHg  Pulse 89  Temp(Src) 99.1 F (37.3 C) (Oral)  Resp 18  Ht  5\' 1"  (1.549 m)  Wt 168 lb (76.204 kg)  BMI 31.76 kg/m2  SpO2 95% Physical Exam  Constitutional: She is oriented to person, place, and time. She appears well-developed and well-nourished.  Overweight, uncomfortable appearing  HENT:  Head: Normocephalic and atraumatic.  Cardiovascular: Normal rate, regular rhythm and normal heart sounds.   Pulmonary/Chest: Effort normal and breath sounds normal. No respiratory distress. She has no wheezes.  Abdominal: Soft. Bowel sounds are normal. There is tenderness. There is no rebound and no guarding.  Right upper quadrant tenderness to palpation without rebound or guarding  Neurological: She is alert and oriented to person, place, and time.   Skin: Skin is warm and dry.  Psychiatric: She has a normal mood and affect.  Nursing note and vitals reviewed.   ED Course  Procedures (including critical care time) DIAGNOSTIC STUDIES: Oxygen Saturation is 96% on RA, normal by my interpretation.    COORDINATION OF CARE: 11:16 PM Discussed treatment plan with pt at bedside and pt agreed to plan.  Labs Review Labs Reviewed  CBC WITH DIFFERENTIAL - Abnormal; Notable for the following:    WBC 12.6 (*)    Hemoglobin 10.9 (*)    HCT 32.7 (*)    MCV 67.0 (*)    MCH 22.3 (*)    Neutrophils Relative % 84 (*)    Lymphocytes Relative 7 (*)    Neutro Abs 10.6 (*)    All other components within normal limits  COMPREHENSIVE METABOLIC PANEL - Abnormal; Notable for the following:    Sodium 135 (*)    Potassium 3.5 (*)    Chloride 94 (*)    Glucose, Bld 138 (*)    AST 38 (*)    ALT 38 (*)    Alkaline Phosphatase 136 (*)    All other components within normal limits  URINALYSIS, ROUTINE W REFLEX MICROSCOPIC - Abnormal; Notable for the following:    Color, Urine AMBER (*)    Ketones, ur 15 (*)    Urobilinogen, UA 4.0 (*)    Leukocytes, UA TRACE (*)    All other components within normal limits  LIPASE, BLOOD  URINE MICROSCOPIC-ADD ON    Imaging Review US Abdomen Complete  04/13/2014   CLINICAL DATA:  Epigastric and right upper quadrant pain yesterday after eating cheese steak off of the food truck. Pain worsening throughout the day.  EXAM: ULTRASOUND ABDOMEN COMPLETE  COMPARISON:  None.  FINDINGS: Gallbladder: Gallbladder is mildly distended and diffusely filled with heterogeneous increased echotexture debris, likely indicating sludge with small stones. Mild gallbladder wall thickening and edema. Murphy's sign is negative.  Common bile duct: Diameter: 2 mm, normal. Periportal and periductal tissues are thickened and echogenic suggesting inflammation. Query cholangitis.  Liver: No focal lesion identified. Within normal limits in  parenchymal echogenicity.  IVC: No abnormality visualized.  Pancreas: Visualized portion unremarkable.  Spleen: Size and appearance within normal limits.  Right Kidney: Length: 11.3 cm. Echogenicity within normal limits. No mass or hydronephrosis visualized.  Left Kidney: Length: 10.8 cm. Echogenicity within normal limits. No mass or hydronephrosis visualized.  Abdominal aorta: No aneurysm visualized.  Other findings: None.  IMPRESSION: Gallbladder is distended and completely filled with echogenic material consistent with sludge and small stones. Mild gallbladder wall thickening and edema. Murphy's sign is negative. No bile duct dilatation. Periportal tissues appear inflamed.   Electronically Signed   By: Lucienne Capers M.D.   On: 04/13/2014 00:40     EKG Interpretation None  MDM   Final diagnoses:  Abdominal pain  Cholecystitis, acute    Patient presents with abdominal pain. She is uncomfortable appearing on exam. Initial vital signs are reassuring. She does have tenderness to palpation of the right upper quadrant. Basic labwork obtained with mild elevation of alkaline phosphatase and mild leukocytosis. Right upper quadrant ultrasound shows distended gallbladder with sludge and small stones as well as gallbladder thickening and edema. In the setting of continued pain and mild leukocytosis, would be concerned for acute cholecystitis. Discussed with Dr. Marcello Moores who has accepted the patient in transfer. Patient was given 2 g of Rocephin, pain, and nausea medication.  I personally performed the services described in this documentation, which was scribed in my presence. The recorded information has been reviewed and is accurate.    Samantha Hacker, MD 04/13/14 [phone]

## 2014-04-12 NOTE — ED Notes (Signed)
MD at bedside. 

## 2014-04-13 ENCOUNTER — Inpatient Hospital Stay (HOSPITAL_COMMUNITY): Payer: Commercial Managed Care - PPO | Admitting: Anesthesiology

## 2014-04-13 ENCOUNTER — Encounter (HOSPITAL_COMMUNITY): Admission: EM | Disposition: A | Discharge status: Home / Self Care | Payer: Self-pay | Source: Home / Self Care

## 2014-04-13 ENCOUNTER — Inpatient Hospital Stay (HOSPITAL_COMMUNITY): Payer: Commercial Managed Care - PPO

## 2014-04-13 DIAGNOSIS — K819 Cholecystitis, unspecified: Secondary | ICD-10-CM | POA: Diagnosis present

## 2014-04-13 HISTORY — PX: CHOLECYSTECTOMY: SHX55

## 2014-04-13 LAB — CBC
HCT: 33.4 % — ABNORMAL LOW (ref 36.0–46.0)
Hemoglobin: 11 g/dL — ABNORMAL LOW (ref 12.0–15.0)
MCH: 22.3 pg — ABNORMAL LOW (ref 26.0–34.0)
MCHC: 32.9 g/dL (ref 30.0–36.0)
MCV: 67.6 fL — ABNORMAL LOW (ref 78.0–100.0)
PLATELETS: 358 10*3/uL (ref 150–400)
RBC: 4.94 MIL/uL (ref 3.87–5.11)
RDW: 15.4 % (ref 11.5–15.5)
WBC: 15.2 10*3/uL — AB (ref 4.0–10.5)

## 2014-04-13 LAB — COMPREHENSIVE METABOLIC PANEL
ALT: 238 U/L — AB (ref 0–35)
AST: 321 U/L — AB (ref 0–37)
Albumin: 3.8 g/dL (ref 3.5–5.2)
Alkaline Phosphatase: 206 U/L — ABNORMAL HIGH (ref 39–117)
Anion gap: 12 (ref 5–15)
BILIRUBIN TOTAL: 1.6 mg/dL — AB (ref 0.3–1.2)
BUN: 7 mg/dL (ref 6–23)
CHLORIDE: 91 meq/L — AB (ref 96–112)
CO2: 29 meq/L (ref 19–32)
CREATININE: 0.54 mg/dL (ref 0.50–1.10)
Calcium: 9.4 mg/dL (ref 8.4–10.5)
GFR calc Af Amer: >90 mL/min (ref >90)
GFR calc non Af Amer: >90 mL/min (ref >90)
Glucose, Bld: 168 mg/dL — ABNORMAL HIGH (ref 70–99)
Potassium: 3.2 mEq/L — ABNORMAL LOW (ref 3.7–5.3)
Sodium: 132 mEq/L — ABNORMAL LOW (ref 137–147)
Total Protein: 8.4 g/dL — ABNORMAL HIGH (ref 6.0–8.3)

## 2014-04-13 LAB — MRSA PCR SCREENING: MRSA BY PCR: NEGATIVE

## 2014-04-13 SURGERY — LAPAROSCOPIC CHOLECYSTECTOMY WITH INTRAOPERATIVE CHOLANGIOGRAM
Anesthesia: General | Site: Abdomen

## 2014-04-13 MED ORDER — HYDROMORPHONE HCL 1 MG/ML IJ SOLN: 0.2500 mg | INTRAMUSCULAR | Status: AC | PRN

## 2014-04-13 MED ORDER — DIPHENHYDRAMINE HCL 50 MG/ML IJ SOLN: 12.5000 mg | Freq: Four times a day (QID) | INTRAMUSCULAR | Status: DC | PRN

## 2014-04-13 MED ORDER — ONDANSETRON HCL 4 MG/2ML IJ SOLN
INTRAMUSCULAR | Status: DC | PRN
  Administered 2014-04-13: 4 mg via INTRAVENOUS

## 2014-04-13 MED ORDER — BUPIVACAINE LIPOSOME 1.3 % IJ SUSP
INTRAMUSCULAR | Status: DC | PRN
  Administered 2014-04-13: 20 mL

## 2014-04-13 MED ORDER — ONDANSETRON HCL 4 MG/2ML IJ SOLN
INTRAMUSCULAR | Status: AC
  Filled 2014-04-13: qty 2

## 2014-04-13 MED ORDER — CEFTRIAXONE SODIUM 2 G IJ SOLR
2.0000 g | Freq: Once | INTRAMUSCULAR | Status: AC
  Administered 2014-04-13: 2 g via INTRAVENOUS

## 2014-04-13 MED ORDER — MIDAZOLAM HCL 5 MG/5ML IJ SOLN
INTRAMUSCULAR | Status: DC | PRN
  Administered 2014-04-13: 2 mg via INTRAVENOUS

## 2014-04-13 MED ORDER — 0.9 % SODIUM CHLORIDE (POUR BTL) OPTIME
TOPICAL | Status: DC | PRN
  Administered 2014-04-13: 1000 mL

## 2014-04-13 MED ORDER — LINACLOTIDE 145 MCG PO CAPS
145.0000 ug | ORAL_CAPSULE | Freq: Every day | ORAL | Status: DC
  Filled 2014-04-13: qty 1

## 2014-04-13 MED ORDER — GLYCOPYRROLATE 0.2 MG/ML IJ SOLN
INTRAMUSCULAR | Status: AC
  Filled 2014-04-13: qty 3

## 2014-04-13 MED ORDER — HYDROCHLOROTHIAZIDE 25 MG PO TABS
25.0000 mg | ORAL_TABLET | Freq: Every day | ORAL | Status: DC
  Administered 2014-04-13: 25 mg via ORAL
  Filled 2014-04-13: qty 1

## 2014-04-13 MED ORDER — ACETAMINOPHEN 325 MG PO TABS: 650.0000 mg | ORAL_TABLET | Freq: Four times a day (QID) | ORAL | Status: DC | PRN

## 2014-04-13 MED ORDER — CEFTRIAXONE SODIUM 1 G IJ SOLR
INTRAMUSCULAR | Status: AC
  Filled 2014-04-13: qty 10

## 2014-04-13 MED ORDER — MORPHINE SULFATE 4 MG/ML IJ SOLN
4.0000 mg | Freq: Once | INTRAMUSCULAR | Status: AC
  Administered 2014-04-13: 4 mg via INTRAVENOUS
  Filled 2014-04-13: qty 1

## 2014-04-13 MED ORDER — ATROPINE SULFATE 0.4 MG/ML IJ SOLN
INTRAMUSCULAR | Status: AC
  Filled 2014-04-13: qty 1

## 2014-04-13 MED ORDER — SODIUM CHLORIDE 0.9 % IV SOLN
10.0000 mg | INTRAVENOUS | Status: DC | PRN
  Administered 2014-04-13: 100 ug/min via INTRAVENOUS

## 2014-04-13 MED ORDER — MIDAZOLAM HCL 2 MG/2ML IJ SOLN
INTRAMUSCULAR | Status: AC
  Filled 2014-04-13: qty 2

## 2014-04-13 MED ORDER — ROCURONIUM BROMIDE 100 MG/10ML IV SOLN
INTRAVENOUS | Status: DC | PRN
  Administered 2014-04-13: 30 mg via INTRAVENOUS

## 2014-04-13 MED ORDER — FENTANYL CITRATE 0.05 MG/ML IJ SOLN
INTRAMUSCULAR | Status: AC
  Filled 2014-04-13: qty 5

## 2014-04-13 MED ORDER — MORPHINE SULFATE 2 MG/ML IJ SOLN
2.0000 mg | INTRAMUSCULAR | Status: DC | PRN
  Administered 2014-04-13: 4 mg via INTRAVENOUS
  Filled 2014-04-13: qty 2

## 2014-04-13 MED ORDER — ENOXAPARIN SODIUM 40 MG/0.4ML ~~LOC~~ SOLN
40.0000 mg | SUBCUTANEOUS | Status: DC
  Filled 2014-04-13: qty 0.4

## 2014-04-13 MED ORDER — METOCLOPRAMIDE HCL 5 MG/ML IJ SOLN
INTRAMUSCULAR | Status: AC
  Filled 2014-04-13: qty 2

## 2014-04-13 MED ORDER — FENTANYL CITRATE 0.05 MG/ML IJ SOLN
12.5000 ug | INTRAMUSCULAR | Status: DC | PRN
  Administered 2014-04-14 – 2014-04-16 (×11): 12.5 ug via INTRAVENOUS
  Filled 2014-04-13 (×11): qty 2

## 2014-04-13 MED ORDER — NEOSTIGMINE METHYLSULFATE 10 MG/10ML IV SOLN
INTRAVENOUS | Status: DC | PRN
Start: 2014-04-13 — End: 2014-04-13
  Administered 2014-04-13: 4 mg via INTRAVENOUS

## 2014-04-13 MED ORDER — DEXAMETHASONE SODIUM PHOSPHATE 10 MG/ML IJ SOLN
INTRAMUSCULAR | Status: AC
Start: 2014-04-13 — End: 2014-04-13
  Filled 2014-04-13: qty 1

## 2014-04-13 MED ORDER — SUCCINYLCHOLINE CHLORIDE 20 MG/ML IJ SOLN
INTRAMUSCULAR | Status: DC | PRN
  Administered 2014-04-13: 100 mg via INTRAVENOUS

## 2014-04-13 MED ORDER — ONDANSETRON HCL 4 MG PO TABS: 4.0000 mg | ORAL_TABLET | Freq: Four times a day (QID) | ORAL | Status: DC | PRN

## 2014-04-13 MED ORDER — MORPHINE SULFATE 2 MG/ML IJ SOLN
2.0000 mg | INTRAMUSCULAR | Status: DC | PRN
  Administered 2014-04-13 (×3): 4 mg via INTRAVENOUS
  Filled 2014-04-13 (×3): qty 2

## 2014-04-13 MED ORDER — PIPERACILLIN-TAZOBACTAM 3.375 G IVPB
3.3750 g | Freq: Three times a day (TID) | INTRAVENOUS | Status: DC
  Administered 2014-04-13 – 2014-04-17 (×12): 3.375 g via INTRAVENOUS
  Filled 2014-04-13 (×13): qty 50

## 2014-04-13 MED ORDER — ESCITALOPRAM OXALATE 10 MG PO TABS
10.0000 mg | ORAL_TABLET | Freq: Every day | ORAL | Status: DC
  Administered 2014-04-13: 10 mg via ORAL
  Filled 2014-04-13: qty 1

## 2014-04-13 MED ORDER — LACTATED RINGERS IV SOLN
INTRAVENOUS | Status: DC | PRN
Start: 2014-04-13 — End: 2014-04-13
  Administered 2014-04-13 (×2): via INTRAVENOUS

## 2014-04-13 MED ORDER — LACTATED RINGERS IR SOLN
Status: DC | PRN
  Administered 2014-04-13: 1000 mL

## 2014-04-13 MED ORDER — LIDOCAINE HCL (CARDIAC) 20 MG/ML IV SOLN
INTRAVENOUS | Status: AC
  Filled 2014-04-13: qty 5

## 2014-04-13 MED ORDER — ROCURONIUM BROMIDE 100 MG/10ML IV SOLN
INTRAVENOUS | Status: AC
  Filled 2014-04-13: qty 1

## 2014-04-13 MED ORDER — IRBESARTAN 150 MG PO TABS
150.0000 mg | ORAL_TABLET | Freq: Every day | ORAL | Status: DC
  Administered 2014-04-13: 150 mg via ORAL
  Filled 2014-04-13: qty 1

## 2014-04-13 MED ORDER — PROPOFOL 10 MG/ML IV BOLUS
INTRAVENOUS | Status: DC | PRN
  Administered 2014-04-13: 150 mg via INTRAVENOUS

## 2014-04-13 MED ORDER — GADOBENATE DIMEGLUMINE 529 MG/ML IV SOLN
15.0000 mL | Freq: Once | INTRAVENOUS | Status: AC | PRN
  Administered 2014-04-13: 15 mL via INTRAVENOUS

## 2014-04-13 MED ORDER — ONDANSETRON HCL 4 MG/2ML IJ SOLN: 4.0000 mg | Freq: Four times a day (QID) | INTRAMUSCULAR | Status: DC | PRN

## 2014-04-13 MED ORDER — KCL IN DEXTROSE-NACL 20-5-0.45 MEQ/L-%-% IV SOLN
INTRAVENOUS | Status: AC
  Filled 2014-04-13: qty 1000

## 2014-04-13 MED ORDER — DOCUSATE SODIUM 100 MG PO CAPS
100.0000 mg | ORAL_CAPSULE | Freq: Two times a day (BID) | ORAL | Status: DC
  Administered 2014-04-13: 100 mg via ORAL
  Filled 2014-04-13 (×3): qty 1

## 2014-04-13 MED ORDER — MORPHINE SULFATE 2 MG/ML IJ SOLN: 2.0000 mg | INTRAMUSCULAR | Status: DC | PRN

## 2014-04-13 MED ORDER — PHENYLEPHRINE HCL 10 MG/ML IJ SOLN
INTRAMUSCULAR | Status: DC | PRN
  Administered 2014-04-13 (×5): 80 ug via INTRAVENOUS

## 2014-04-13 MED ORDER — DEXAMETHASONE SODIUM PHOSPHATE 10 MG/ML IJ SOLN
INTRAMUSCULAR | Status: DC | PRN
  Administered 2014-04-13: 10 mg via INTRAVENOUS

## 2014-04-13 MED ORDER — CISATRACURIUM BESYLATE 20 MG/10ML IV SOLN
INTRAVENOUS | Status: AC
  Filled 2014-04-13: qty 10

## 2014-04-13 MED ORDER — LIDOCAINE HCL (PF) 2 % IJ SOLN
INTRAMUSCULAR | Status: DC | PRN
  Administered 2014-04-13: 75 mg via INTRADERMAL

## 2014-04-13 MED ORDER — LACTATED RINGERS IV SOLN: INTRAVENOUS | Status: DC

## 2014-04-13 MED ORDER — PHENYLEPHRINE 40 MCG/ML (10ML) SYRINGE FOR IV PUSH (FOR BLOOD PRESSURE SUPPORT)
PREFILLED_SYRINGE | INTRAVENOUS | Status: AC
  Filled 2014-04-13: qty 10

## 2014-04-13 MED ORDER — PROPOFOL 10 MG/ML IV BOLUS
INTRAVENOUS | Status: AC
  Filled 2014-04-13: qty 20

## 2014-04-13 MED ORDER — OXYCODONE HCL 5 MG PO TABS
5.0000 mg | ORAL_TABLET | ORAL | Status: DC | PRN
  Administered 2014-04-13: 5 mg via ORAL
  Filled 2014-04-13: qty 1

## 2014-04-13 MED ORDER — POTASSIUM CHLORIDE CRYS ER 10 MEQ PO TBCR
10.0000 meq | EXTENDED_RELEASE_TABLET | Freq: Every day | ORAL | Status: DC
  Administered 2014-04-13: 10 meq via ORAL
  Filled 2014-04-13: qty 1

## 2014-04-13 MED ORDER — KCL IN DEXTROSE-NACL 20-5-0.45 MEQ/L-%-% IV SOLN
INTRAVENOUS | Status: DC
  Administered 2014-04-13 – 2014-04-16 (×4): via INTRAVENOUS
  Filled 2014-04-13 (×10): qty 1000

## 2014-04-13 MED ORDER — HEPARIN SODIUM (PORCINE) 5000 UNIT/ML IJ SOLN
5000.0000 [IU] | Freq: Three times a day (TID) | INTRAMUSCULAR | Status: DC
  Administered 2014-04-14 – 2014-04-18 (×14): 5000 [IU] via SUBCUTANEOUS
  Filled 2014-04-13 (×17): qty 1

## 2014-04-13 MED ORDER — DEXTROSE 5 % IV SOLN
2.0000 g | Freq: Four times a day (QID) | INTRAVENOUS | Status: DC
  Administered 2014-04-13 (×2): 2 g via INTRAVENOUS
  Filled 2014-04-13 (×3): qty 2

## 2014-04-13 MED ORDER — FENTANYL CITRATE 0.05 MG/ML IJ SOLN
INTRAMUSCULAR | Status: DC | PRN
  Administered 2014-04-13 (×2): 100 ug via INTRAVENOUS
  Administered 2014-04-13: 50 ug via INTRAVENOUS

## 2014-04-13 MED ORDER — KCL IN DEXTROSE-NACL 20-5-0.45 MEQ/L-%-% IV SOLN
INTRAVENOUS | Status: DC
  Administered 2014-04-13: 05:00:00 via INTRAVENOUS
  Filled 2014-04-13 (×2): qty 1000

## 2014-04-13 MED ORDER — AZELASTINE HCL 0.1 % NA SOLN
2.0000 | Freq: Every day | NASAL | Status: DC
  Filled 2014-04-13: qty 30

## 2014-04-13 MED ORDER — BUPIVACAINE LIPOSOME 1.3 % IJ SUSP
20.0000 mL | Freq: Once | INTRAMUSCULAR | Status: DC
  Filled 2014-04-13: qty 20

## 2014-04-13 MED ORDER — VALSARTAN-HYDROCHLOROTHIAZIDE 160-25 MG PO TABS: 1.0000 | ORAL_TABLET | Freq: Every day | ORAL | Status: DC

## 2014-04-13 MED ORDER — GLYCOPYRROLATE 0.2 MG/ML IJ SOLN
INTRAMUSCULAR | Status: DC | PRN
  Administered 2014-04-13: 0.6 mg via INTRAVENOUS

## 2014-04-13 MED ORDER — DIPHENHYDRAMINE HCL 12.5 MG/5ML PO ELIX: 12.5000 mg | ORAL_SOLUTION | Freq: Four times a day (QID) | ORAL | Status: DC | PRN

## 2014-04-13 MED ORDER — ACETAMINOPHEN 650 MG RE SUPP: 650.0000 mg | Freq: Four times a day (QID) | RECTAL | Status: DC | PRN

## 2014-04-13 SURGICAL SUPPLY — 36 items
APPLIER CLIP ROT 10 11.4 M/L (STAPLE) ×3
BENZOIN TINCTURE PRP APPL 2/3 (GAUZE/BANDAGES/DRESSINGS) IMPLANT
CABLE HIGH FREQUENCY MONO STRZ (ELECTRODE) IMPLANT
CATH REDDICK CHOLANGI 4FR 50CM (CATHETERS) ×3 IMPLANT
CLIP APPLIE ROT 10 11.4 M/L (STAPLE) ×1 IMPLANT
CLOSURE WOUND 1/2 X4 (GAUZE/BANDAGES/DRESSINGS)
COVER MAYO STAND STRL (DRAPES) ×3 IMPLANT
DECANTER SPIKE VIAL GLASS SM (MISCELLANEOUS) ×3 IMPLANT
DRAIN CHANNEL 19F RND (DRAIN) ×3 IMPLANT
DRAPE C-ARM 42X120 X-RAY (DRAPES) ×3 IMPLANT
DRAPE LAPAROSCOPIC ABDOMINAL (DRAPES) ×3 IMPLANT
ELECT REM PT RETURN 9FT ADLT (ELECTROSURGICAL) ×3
ELECTRODE REM PT RTRN 9FT ADLT (ELECTROSURGICAL) ×1 IMPLANT
EVACUATOR SILICONE 100CC (DRAIN) ×3 IMPLANT
GLOVE BIOGEL M 8.0 STRL (GLOVE) ×3 IMPLANT
GOWN STRL REUS W/TWL XL LVL3 (GOWN DISPOSABLE) ×12 IMPLANT
HEMOSTAT SURGICEL 4X8 (HEMOSTASIS) IMPLANT
IV CATH 14GX2 1/4 (CATHETERS) ×3 IMPLANT
KIT BASIN OR (CUSTOM PROCEDURE TRAY) ×3 IMPLANT
LIQUID BAND (GAUZE/BANDAGES/DRESSINGS) ×3 IMPLANT
POUCH RETRIEVAL ECOSAC 10 (ENDOMECHANICALS) ×1 IMPLANT
POUCH RETRIEVAL ECOSAC 10MM (ENDOMECHANICALS) ×2
SCISSORS LAP 5X45 EPIX DISP (ENDOMECHANICALS) ×3 IMPLANT
SCRUB PCMX 4 OZ (MISCELLANEOUS) ×3 IMPLANT
SET IRRIG TUBING LAPAROSCOPIC (IRRIGATION / IRRIGATOR) ×3 IMPLANT
SHEARS HARMONIC ACE PLUS 36CM (ENDOMECHANICALS) ×3 IMPLANT
SLEEVE XCEL OPT CAN 5 100 (ENDOMECHANICALS) ×6 IMPLANT
STRIP CLOSURE SKIN 1/2X4 (GAUZE/BANDAGES/DRESSINGS) IMPLANT
SUT VIC AB 4-0 SH 18 (SUTURE) ×3 IMPLANT
SYR 20CC LL (SYRINGE) ×3 IMPLANT
TOWEL OR 17X26 10 PK STRL BLUE (TOWEL DISPOSABLE) ×3 IMPLANT
TRAY LAPAROSCOPIC (CUSTOM PROCEDURE TRAY) ×3 IMPLANT
TROCAR BLADELESS OPT 5 100 (ENDOMECHANICALS) ×6 IMPLANT
TROCAR BLADELESS OPT 5 75 (ENDOMECHANICALS) ×3 IMPLANT
TROCAR XCEL BLUNT TIP 100MML (ENDOMECHANICALS) IMPLANT
TROCAR XCEL NON-BLD 11X100MML (ENDOMECHANICALS) ×3 IMPLANT

## 2014-04-13 NOTE — Progress Notes (Signed)
Day of Surgery  Subjective: She feels bad and just got back from MRI.  I saw her earlier and she is tentatively down for surgery later if we can fit her  In. Objective: Vital signs in last 24 hours: Temp:  [97.6 F (36.4 C)-99.7 F (37.6 C)] 98.5 F (36.9 C) (11/19 0907) Pulse Rate:  [84-90] 90 (11/19 0907) Resp:  [14-18] 16 (11/19 0907) BP: (150-162)/(68-85) 154/85 mmHg (11/19 0907) SpO2:  [95 %-98 %] 97 % (11/19 0907) Weight:  [76.204 kg (168 lb)-77.111 kg (170 lb)] 77.111 kg (170 lb) (11/19 1109) Last BM Date: 04/12/14  Intake/Output from previous day: 11/18 0701 - 11/19 0700 In: 96.3 [I.V.:96.3] Out: 250 [Urine:250] Intake/Output this shift: Total I/O In: -  Out: 150 [Urine:150]  General appearance: alert, cooperative and no distress Resp: clear to auscultation bilaterally GI: pain ongoing RUQ  Lab Results:   Recent Labs  04/12/14 2255 04/13/14 0421  WBC 12.6* 15.2*  HGB 10.9* 11.0*  HCT 32.7* 33.4*  PLT 338 358    BMET  Recent Labs  04/12/14 2255 04/13/14 0421  NA 135* 132*  K 3.5* 3.2*  CL 94* 91*  CO2 26 29  GLUCOSE 138* 168*  BUN 7 7  CREATININE 0.50 0.54  CALCIUM 9.3 9.4   PT/INR No results for input(s): LABPROT, INR in the last 72 hours.   Recent Labs Lab 04/12/14 2255 04/13/14 0421  AST 38* 321*  ALT 38* 238*  ALKPHOS 136* 206*  BILITOT 0.9 1.6*  PROT 8.0 8.4*  ALBUMIN 3.9 3.8     Lipase     Component Value Date/Time   LIPASE 24 04/12/2014 2255     Studies/Results: US Abdomen Complete  04/13/2014   CLINICAL DATA:  Epigastric and right upper quadrant pain yesterday after eating cheese steak off of the food truck. Pain worsening throughout the day.  EXAM: ULTRASOUND ABDOMEN COMPLETE  COMPARISON:  None.  FINDINGS: Gallbladder: Gallbladder is mildly distended and diffusely filled with heterogeneous increased echotexture debris, likely indicating sludge with small stones. Mild gallbladder wall thickening and edema. Murphy's sign  is negative.  Common bile duct: Diameter: 2 mm, normal. Periportal and periductal tissues are thickened and echogenic suggesting inflammation. Query cholangitis.  Liver: No focal lesion identified. Within normal limits in parenchymal echogenicity.  IVC: No abnormality visualized.  Pancreas: Visualized portion unremarkable.  Spleen: Size and appearance within normal limits.  Right Kidney: Length: 11.3 cm. Echogenicity within normal limits. No mass or hydronephrosis visualized.  Left Kidney: Length: 10.8 cm. Echogenicity within normal limits. No mass or hydronephrosis visualized.  Abdominal aorta: No aneurysm visualized.  Other findings: None.  IMPRESSION: Gallbladder is distended and completely filled with echogenic material consistent with sludge and small stones. Mild gallbladder wall thickening and edema. Murphy's sign is negative. No bile duct dilatation. Periportal tissues appear inflamed.   Electronically Signed   By: Lucienne Capers M.D.   On: 04/13/2014 00:40    Medications: . azelastine  2 spray Each Nare Daily  . cefOXitin  2 g Intravenous 4 times per day  . docusate sodium  100 mg Oral BID  . enoxaparin (LOVENOX) injection  40 mg Subcutaneous Q24H  . escitalopram  10 mg Oral Daily  . irbesartan  150 mg Oral Daily   And  . hydrochlorothiazide  25 mg Oral Daily  . Linaclotide  145 mcg Oral Daily  . potassium chloride  10 mEq Oral Daily   . dextrose 5 % and 0.45 % NaCl with KCl  20 mEq/L 75 mL/hr at 04/13/14 0443   Prior to Admission medications   Medication Sig Start Date End Date Taking? Authorizing Provider  azelastine (ASTELIN) 137 MCG/SPRAY nasal spray Place 2 sprays into the nose daily. Use in each nostril as directed    Yes Historical Provider, MD  dicyclomine (BENTYL) 20 MG tablet Take 20 mg by mouth every 6 (six) hours.   Yes Historical Provider, MD  DIOVAN HCT 160-25 MG per tablet Take 1 tablet by mouth Daily. 12/07/10  Yes Historical Provider, MD  escitalopram (LEXAPRO) 10 MG  tablet Take 10 mg by mouth daily.   Yes Historical Provider, MD  Fe Fum-FePoly-FA-Vit C-Vit B3 (INTEGRA F) 125-1 MG CAPS Take 1 capsule by mouth daily. 08/12/12  Yes Lanice Shirts, MD  Linaclotide Rolan Lipa) 145 MCG CAPS Take 145 mcg by mouth daily.    Yes Historical Provider, MD  potassium chloride (K-DUR,KLOR-CON) 10 MEQ tablet Take two tablets daily 04/07/13  Yes Lanice Shirts, MD  promethazine (PHENERGAN) 25 MG tablet Take 25 mg by mouth every 6 (six) hours as needed for nausea or vomiting.   Yes Historical Provider, MD     Assessment/Plan Cholecystitis with cholelithiasis Rising LFT's Hx of arthritis  Hx of hypertension Hx of anxiety  Plan:  We are going to get an MRCP to be sure she does not have a stone in CBD   We are keeping her NPO for possible surgery today.  She is just coming back from MRI.  MRI:1. Gallstones and sludge with moderate to marked inflammation centered about the gallbladder. Findings are most consistent with acute cholecystitis. 2. No evidence of choledocholithiasis or biliary ductal dilatation. The common duct is poorly evaluated in the region of porta hepatis inflammation, but no proximal dilatation is seen to suggest obstruction. 3. Trace perihepatic ascites. 4. Hepatomegaly. 5. Left hepatic lobe hemangioma. 6. Mild motion degradation.  LOS: 1 day    Hawkin Charo 04/13/2014

## 2014-04-13 NOTE — Anesthesia Preprocedure Evaluation (Signed)
Anesthesia Evaluation  Patient identified by MRN, date of birth, ID band Patient awake    Reviewed: Allergy & Precautions, H&P , NPO status , Patient's Chart, lab work & pertinent test results  Airway Mallampati: II  TM Distance: <3 FB Neck ROM: Full    Dental no notable dental hx.    Pulmonary neg pulmonary ROS,  breath sounds clear to auscultation  Pulmonary exam normal       Cardiovascular hypertension, Pt. on medications Rhythm:Regular Rate:Normal     Neuro/Psych negative neurological ROS  negative psych ROS   GI/Hepatic negative GI ROS, Neg liver ROS,   Endo/Other  negative endocrine ROS  Renal/GU negative Renal ROS  negative genitourinary   Musculoskeletal negative musculoskeletal ROS (+)   Abdominal   Peds negative pediatric ROS (+)  Hematology  (+) anemia ,   Anesthesia Other Findings   Reproductive/Obstetrics negative OB ROS                             Anesthesia Physical Anesthesia Plan  ASA: II  Anesthesia Plan: General   Post-op Pain Management:    Induction: Intravenous  Airway Management Planned: Oral ETT  Additional Equipment:   Intra-op Plan:   Post-operative Plan: Extubation in OR  Informed Consent: I have reviewed the patients History and Physical, chart, labs and discussed the procedure including the risks, benefits and alternatives for the proposed anesthesia with the patient or authorized representative who has indicated his/her understanding and acceptance.   Dental advisory given  Plan Discussed with: CRNA and Surgeon  Anesthesia Plan Comments:         Anesthesia Quick Evaluation

## 2014-04-13 NOTE — H&P (Signed)
Samantha Horton is an 53 y.o. female.   Chief Complaint: RUQ pain HPI: Samantha Horton is a 53 y.o. female who presented to the [facility] complaining of constant, cramping abdominal pain that started Tues. This is associated with nausea and general malaise. Pt denies history of similar symptoms and EtOH use. Her last BM was this morning and was normal. Pt denies vomiting or fevers.  Past Medical History  Diagnosis Date  . Anemia   . Arthritis   . Headache(784.0)     history of migraine per pt  . Allergy     allergic rhinitis  . Hypertension   . Anxiety     Past Surgical History  Procedure Laterality Date  . Knee surgery  12/2006    Right  . Endometrial ablation    . Breast surgery      Family History  Problem Relation Age of Onset  . Hypertension Mother   . Migraines Mother   . Arthritis Mother   . Hypertension Father    Social History:  reports that she has never smoked. She has never used smokeless tobacco. She reports that she does not drink alcohol or use illicit drugs.  Allergies:  Allergies  Allergen Reactions  . Codeine Nausea And Vomiting  . Vicodin [Hydrocodone-Acetaminophen] Nausea And Vomiting     (Not in a hospital admission)  Results for orders placed or performed during the hospital encounter of 04/12/14 (from the past 48 hour(s))  Urinalysis, Routine w reflex microscopic     Status: Abnormal   Collection Time: 04/12/14 10:44 PM  Result Value Ref Range   Color, Urine AMBER (A) YELLOW    Comment: BIOCHEMICALS MAY BE AFFECTED BY COLOR   APPearance CLEAR CLEAR   Specific Gravity, Urine 1.022 1.005 - 1.030   pH 7.0 5.0 - 8.0   Glucose, UA NEGATIVE NEGATIVE mg/dL   Hgb urine dipstick NEGATIVE NEGATIVE   Bilirubin Urine NEGATIVE NEGATIVE   Ketones, ur 15 (A) NEGATIVE mg/dL   Protein, ur NEGATIVE NEGATIVE mg/dL   Urobilinogen, UA 4.0 (H) 0.0 - 1.0 mg/dL   Nitrite NEGATIVE NEGATIVE   Leukocytes, UA TRACE (A) NEGATIVE  Urine  microscopic-add on     Status: None   Collection Time: 04/12/14 10:44 PM  Result Value Ref Range   Squamous Epithelial / LPF RARE RARE   WBC, UA 0-2 <3 WBC/hpf   RBC / HPF 0-2 <3 RBC/hpf   Bacteria, UA RARE RARE   Urine-Other MUCOUS PRESENT   CBC with Differential     Status: Abnormal   Collection Time: 04/12/14 10:55 PM  Result Value Ref Range   WBC 12.6 (H) 4.0 - 10.5 K/uL   RBC 4.88 3.87 - 5.11 MIL/uL   Hemoglobin 10.9 (L) 12.0 - 15.0 g/dL   HCT 32.7 (L) 36.0 - 46.0 %   MCV 67.0 (L) 78.0 - 100.0 fL   MCH 22.3 (L) 26.0 - 34.0 pg   MCHC 33.3 30.0 - 36.0 g/dL   RDW 15.4 11.5 - 15.5 %   Platelets 338 150 - 400 K/uL   Neutrophils Relative % 84 (H) 43 - 77 %   Lymphocytes Relative 7 (L) 12 - 46 %   Monocytes Relative 8 3 - 12 %   Eosinophils Relative 1 0 - 5 %   Basophils Relative 0 0 - 1 %   Neutro Abs 10.6 (H) 1.7 - 7.7 K/uL   Lymphs Abs 0.9 0.7 - 4.0 K/uL   Monocytes Absolute  1.0 0.1 - 1.0 K/uL   Eosinophils Absolute 0.1 0.0 - 0.7 K/uL   Basophils Absolute 0.0 0.0 - 0.1 K/uL  Comprehensive metabolic panel     Status: Abnormal   Collection Time: 04/12/14 10:55 PM  Result Value Ref Range   Sodium 135 (L) 137 - 147 mEq/L   Potassium 3.5 (L) 3.7 - 5.3 mEq/L   Chloride 94 (L) 96 - 112 mEq/L   CO2 26 19 - 32 mEq/L   Glucose, Bld 138 (H) 70 - 99 mg/dL   BUN 7 6 - 23 mg/dL   Creatinine, Ser 0.50 0.50 - 1.10 mg/dL   Calcium 9.3 8.4 - 10.5 mg/dL   Total Protein 8.0 6.0 - 8.3 g/dL   Albumin 3.9 3.5 - 5.2 g/dL   AST 38 (H) 0 - 37 U/L   ALT 38 (H) 0 - 35 U/L   Alkaline Phosphatase 136 (H) 39 - 117 U/L   Total Bilirubin 0.9 0.3 - 1.2 mg/dL   GFR calc non Af Amer >90 >90 mL/min   GFR calc Af Amer >90 >90 mL/min    Comment: (NOTE) The eGFR has been calculated using the CKD EPI equation. This calculation has not been validated in all clinical situations. eGFR's persistently <90 mL/min signify possible Chronic Kidney Disease.    Anion gap 15 5 - 15  Lipase, blood     Status:  None   Collection Time: 04/12/14 10:55 PM  Result Value Ref Range   Lipase 24 11 - 59 U/L   US Abdomen Complete  04/13/2014   CLINICAL DATA:  Epigastric and right upper quadrant pain yesterday after eating cheese steak off of the food truck. Pain worsening throughout the day.  EXAM: ULTRASOUND ABDOMEN COMPLETE  COMPARISON:  None.  FINDINGS: Gallbladder: Gallbladder is mildly distended and diffusely filled with heterogeneous increased echotexture debris, likely indicating sludge with small stones. Mild gallbladder wall thickening and edema. Murphy's sign is negative.  Common bile duct: Diameter: 2 mm, normal. Periportal and periductal tissues are thickened and echogenic suggesting inflammation. Query cholangitis.  Liver: No focal lesion identified. Within normal limits in parenchymal echogenicity.  IVC: No abnormality visualized.  Pancreas: Visualized portion unremarkable.  Spleen: Size and appearance within normal limits.  Right Kidney: Length: 11.3 cm. Echogenicity within normal limits. No mass or hydronephrosis visualized.  Left Kidney: Length: 10.8 cm. Echogenicity within normal limits. No mass or hydronephrosis visualized.  Abdominal aorta: No aneurysm visualized.  Other findings: None.  IMPRESSION: Gallbladder is distended and completely filled with echogenic material consistent with sludge and small stones. Mild gallbladder wall thickening and edema. Murphy's sign is negative. No bile duct dilatation. Periportal tissues appear inflamed.   Electronically Signed   By: Lucienne Capers M.D.   On: 04/13/2014 00:40    Review of Systems  Constitutional: Negative for fever and chills.  Eyes: Negative for blurred vision.  Respiratory: Negative for cough, sputum production and shortness of breath.   Cardiovascular: Negative for chest pain and palpitations.  Gastrointestinal: Positive for nausea and abdominal pain. Negative for vomiting, diarrhea and constipation.       Mid epigastric and RUQ tenderness   Genitourinary: Negative for dysuria, urgency and frequency.  Musculoskeletal: Negative for myalgias.  Skin: Negative for rash.  Neurological: Negative for dizziness and headaches.    Blood pressure 150/68, pulse 88, temperature 99.1 F (37.3 C), temperature source Oral, resp. rate 18, height '5\' 1"'  (1.549 m), weight 168 lb (76.204 kg), SpO2 98 %. Physical Exam  Constitutional: She is oriented to person, place, and time. She appears well-developed and well-nourished. No distress.  HENT:  Head: Normocephalic and atraumatic.  Eyes: Pupils are equal, round, and reactive to light.  Neck: Normal range of motion.  Cardiovascular: Normal rate and regular rhythm.   Respiratory: Effort normal and breath sounds normal. No respiratory distress.  GI: Soft. She exhibits no distension. There is tenderness.  RUQ tenderness  Musculoskeletal: Normal range of motion.  Neurological: She is alert and oriented to person, place, and time.  Skin: Skin is warm and dry. She is not diaphoretic.     Assessment/Plan Admit to [facility] med surg.  Will start IV antibiotics given elevated wbc.  Will keep NPO for possible surgery later today.  Pain and nausea control.  Guerry Covington C. 08/56/9437, 1:36 AM

## 2014-04-13 NOTE — Plan of Care (Signed)
Problem: Consults Goal: Skin Care Protocol Initiated - if Braden Score 18 or less If consults are not indicated, leave blank or document N/A  Outcome: Not Applicable Date Met:  44/96/75 Goal: Diabetes Guidelines if Diabetic/Glucose > 140 If diabetic or lab glucose is > 140 mg/dl - Initiate Diabetes/Hyperglycemia Guidelines & Document Interventions  Outcome: Not Applicable Date Met:  91/63/84  Problem: Phase I Progression Outcomes Goal: Pain controlled with appropriate interventions Outcome: Not Progressing Poor pain control, but sx scheduled in pm Goal: OOB as tolerated unless otherwise ordered Outcome: Not Progressing Bedrest this am due to pain. Goal: Initial discharge plan identified Outcome: Completed/Met Date Met:  04/13/14 Goal: Voiding-avoid urinary catheter unless indicated Outcome: Completed/Met Date Met:  04/13/14 Goal: Hemodynamically stable Outcome: Completed/Met Date Met:  04/13/14  Problem: Phase II Progression Outcomes Goal: Progressing with IS, TCDB Outcome: Completed/Met Date Met:  04/13/14

## 2014-04-13 NOTE — Plan of Care (Signed)
Problem: Consults Goal: General Medical Patient Education See Patient Education Module for specific education. Outcome: Completed/Met Date Met:  04/13/14

## 2014-04-13 NOTE — Op Note (Signed)
Surgeon: Kaylyn Lim, MD, FACS  Asst:  none  Anes:  general  Procedure: Laparoscopic partial cholecystectomy with stone removal and placement of 19 blake drain  Diagnosis: Gangrenous cholecystitis  Complications: none  EBL:   15 cc  Drains: 19 Blake drain  Description of Procedure:  The patient was taken to OR 1 at [facility].  After anesthesia was administered and the patient was prepped a timeout was performed.  Access to the abdomen was achieved with a 5 mm Optiview technique through the right upper quadrant.  The liver anatomy was immediately noted to be aberrant with a hypertrophied left lateral segment and a diminutive right lobe in the gallbladder bed being shifted far to the right. In addition the gallbladder was noted to be jet black in bed from the fundus all the way down toward the infundibulum. I am that up placing a cluster of trochars on the right side laterally and set about to retract the left lateral segment and enable me to try to see the infundibulum which like could see but the cystic duct was obliterated appeared to be really inflamed and contiguous with the common bile duct porta hepatis structures. After surveying situation first socked out the material in the fundus but found that the infundibulum was still somewhat dilated. Eventually I used a harmonic scalpel and entered into the gallbladder and excised the anterior wall of the gallbladder from the infundibulum out to the fundus. Within the fundus I scooped out many small black anthracotic pigment stones. I irrigated and cleaned this area out and then used a cautery to cauterize the lining of the gallbladder bed. A drain was then placed in the far lateral port and this 19 Blake drain was cut in the tip and it was placed into the gallbladder and then secured to the side of the abdomen with a 3-0 nylon. Everything else appeared to be in order. I'll elected to deflate the abdomen. The gallbladder wall had actually been placed in a bag  and brought out and sent to pathology. Wounds were closed with 4-0 Vicryl and Dermabond.  The patient tolerated the procedure well and was taken to the PACU in stable condition.     Matt B. Hassell Done, [place], [facility], [place]

## 2014-04-13 NOTE — Anesthesia Postprocedure Evaluation (Signed)
  Anesthesia Post-op Note  Patient: Samantha Horton  Procedure(s) Performed: Procedure(s) (LRB): LAPAROSCOPIC PARTIAL CHOLECYSTECTOMY WITH STONE REMOVAL (N/A)  Patient Location: PACU  Anesthesia Type: General  Level of Consciousness: awake and alert   Airway and Oxygen Therapy: Patient Spontanous Breathing  Post-op Pain: mild  Post-op Assessment: Post-op Vital signs reviewed, Patient's Cardiovascular Status Stable, Respiratory Function Stable, Patent Airway and No signs of Nausea or vomiting  Last Vitals:  Filed Vitals:   04/13/14 1715  BP: 105/55  Pulse: 89  Temp:   Resp: 18    Post-op Vital Signs: stable   Complications: No apparent anesthesia complications

## 2014-04-13 NOTE — Interval H&P Note (Signed)
History and Physical Interval Note:  04/13/2014 2:15 PM  Samantha Horton  has presented today for surgery, with the diagnosis of gallstones  The various methods of treatment have been discussed with the patient and family. After consideration of risks, benefits and other options for treatment, the patient has consented to  Procedure(s): LAPAROSCOPIC CHOLECYSTECTOMY WITH INTRAOPERATIVE CHOLANGIOGRAM (N/A) as a surgical intervention .  The patient's history has been reviewed, patient examined, no change in status, stable for surgery.  I have reviewed the patient's chart and labs.  Questions were answered to the patient's satisfaction.     Iliza Blankenbeckler B

## 2014-04-13 NOTE — Transfer of Care (Signed)
Immediate Anesthesia Transfer of Care Note  Patient: Samantha Horton  Procedure(s) Performed: Procedure(s): LAPAROSCOPIC PARTIAL CHOLECYSTECTOMY WITH STONE REMOVAL (N/A)  Patient Location: PACU  Anesthesia Type:General  Level of Consciousness: awake, sedated, patient cooperative and responds to stimulation  Airway & Oxygen Therapy: Patient Spontanous Breathing and Patient connected to face mask oxygen  Post-op Assessment: Report given to PACU RN and Post -op Vital signs reviewed and stable  Post vital signs: Reviewed and stable  Complications: No apparent anesthesia complications

## 2014-04-14 ENCOUNTER — Encounter (HOSPITAL_COMMUNITY): Payer: Self-pay | Admitting: Surgery

## 2014-04-14 DIAGNOSIS — K810 Acute cholecystitis: Secondary | ICD-10-CM | POA: Diagnosis present

## 2014-04-14 LAB — COMPREHENSIVE METABOLIC PANEL
ALBUMIN: 2.9 g/dL — AB (ref 3.5–5.2)
ALK PHOS: 211 U/L — AB (ref 39–117)
ALT: 236 U/L — ABNORMAL HIGH (ref 0–35)
ALT: 201 U/L — AB (ref 0–35)
ANION GAP: 13 (ref 5–15)
AST: 131 U/L — AB (ref 0–37)
AST: 83 U/L — AB (ref 0–37)
Albumin: 2.9 g/dL — ABNORMAL LOW (ref 3.5–5.2)
Alkaline Phosphatase: 226 U/L — ABNORMAL HIGH (ref 39–117)
Anion gap: 9 (ref 5–15)
BUN: 8 mg/dL (ref 6–23)
BUN: 8 mg/dL (ref 6–23)
CALCIUM: 9.1 mg/dL (ref 8.4–10.5)
CHLORIDE: 94 meq/L — AB (ref 96–112)
CO2: 27 mEq/L (ref 19–32)
CO2: 32 meq/L (ref 19–32)
CREATININE: 0.7 mg/dL (ref 0.50–1.10)
Calcium: 9.2 mg/dL (ref 8.4–10.5)
Chloride: 96 mEq/L (ref 96–112)
Creatinine, Ser: 0.61 mg/dL (ref 0.50–1.10)
GFR calc Af Amer: >90 mL/min (ref >90)
GFR calc Af Amer: >90 mL/min (ref >90)
GFR calc non Af Amer: >90 mL/min (ref >90)
Glucose, Bld: 151 mg/dL — ABNORMAL HIGH (ref 70–99)
Glucose, Bld: 112 mg/dL — ABNORMAL HIGH (ref 70–99)
Potassium: 3.8 mEq/L (ref 3.7–5.3)
Potassium: 3.9 mEq/L (ref 3.7–5.3)
SODIUM: 135 meq/L — AB (ref 137–147)
Sodium: 136 mEq/L — ABNORMAL LOW (ref 137–147)
Total Bilirubin: 2.8 mg/dL — ABNORMAL HIGH (ref 0.3–1.2)
Total Bilirubin: 1.7 mg/dL — ABNORMAL HIGH (ref 0.3–1.2)
Total Protein: 7.1 g/dL (ref 6.0–8.3)
Total Protein: 7.1 g/dL (ref 6.0–8.3)

## 2014-04-14 LAB — CBC
HCT: 34.3 % — ABNORMAL LOW (ref 36.0–46.0)
Hemoglobin: 10.7 g/dL — ABNORMAL LOW (ref 12.0–15.0)
MCH: 21.7 pg — ABNORMAL LOW (ref 26.0–34.0)
MCHC: 31.2 g/dL (ref 30.0–36.0)
MCV: 69.4 fL — AB (ref 78.0–100.0)
Platelets: 276 10*3/uL (ref 150–400)
RBC: 4.94 MIL/uL (ref 3.87–5.11)
RDW: 15.5 % (ref 11.5–15.5)
WBC: 21.2 10*3/uL — ABNORMAL HIGH (ref 4.0–10.5)

## 2014-04-14 MED ORDER — POTASSIUM CHLORIDE CRYS ER 10 MEQ PO TBCR
10.0000 meq | EXTENDED_RELEASE_TABLET | Freq: Every day | ORAL | Status: DC
  Administered 2014-04-14 – 2014-04-16 (×3): 10 meq via ORAL
  Filled 2014-04-14 (×4): qty 1

## 2014-04-14 MED ORDER — HYDROCHLOROTHIAZIDE 25 MG PO TABS
25.0000 mg | ORAL_TABLET | Freq: Every day | ORAL | Status: DC
  Administered 2014-04-14 – 2014-04-18 (×5): 25 mg via ORAL
  Filled 2014-04-14 (×5): qty 1

## 2014-04-14 MED ORDER — ESCITALOPRAM OXALATE 10 MG PO TABS
10.0000 mg | ORAL_TABLET | Freq: Every day | ORAL | Status: DC
  Administered 2014-04-14 – 2014-04-18 (×5): 10 mg via ORAL
  Filled 2014-04-14 (×5): qty 1

## 2014-04-14 MED ORDER — AZELASTINE HCL 0.1 % NA SOLN
2.0000 | Freq: Every day | NASAL | Status: DC
  Administered 2014-04-15 – 2014-04-16 (×2): 2 via NASAL
  Filled 2014-04-14: qty 30

## 2014-04-14 MED ORDER — IRBESARTAN 150 MG PO TABS
150.0000 mg | ORAL_TABLET | Freq: Every day | ORAL | Status: DC
Start: 2014-04-14 — End: 2014-04-18
  Administered 2014-04-14 – 2014-04-18 (×5): 150 mg via ORAL
  Filled 2014-04-14 (×5): qty 1

## 2014-04-14 MED ORDER — LINACLOTIDE 145 MCG PO CAPS
145.0000 ug | ORAL_CAPSULE | Freq: Every day | ORAL | Status: DC
  Administered 2014-04-14: 145 ug via ORAL
  Filled 2014-04-14 (×5): qty 1

## 2014-04-14 NOTE — Plan of Care (Signed)
Problem: Phase I Progression Outcomes Goal: Pain controlled with appropriate interventions Outcome: Completed/Met Date Met:  04/14/14     

## 2014-04-14 NOTE — Plan of Care (Signed)
Problem: Phase I Progression Outcomes Goal: Incision/dressings dry and intact Outcome: Completed/Met Date Met:  04/14/14 Goal: Tubes/drains patent Outcome: Completed/Met Date Met:  04/14/14 Goal: Initial discharge plan identified Outcome: Completed/Met Date Met:  04/14/14 Goal: Vital signs/hemodynamically stable Outcome: Completed/Met Date Met:  04/14/14 Goal: Other Phase I Outcomes/Goals Outcome: Completed/Met Date Met:  04/14/14  Problem: Phase II Progression Outcomes Goal: Progress activity as tolerated unless otherwise ordered Outcome: Completed/Met Date Met:  04/14/14

## 2014-04-14 NOTE — Plan of Care (Signed)
Problem: Phase I Progression Outcomes Goal: OOB as tolerated unless otherwise ordered Outcome: Completed/Met Date Met:  04/14/14     

## 2014-04-14 NOTE — Progress Notes (Signed)
1 Day Post-Op  Subjective: Up walking, sore but feels much better than yesterday.  Objective: Vital signs in last 24 hours: Temp:  [97.5 F (36.4 C)-99.6 F (37.6 C)] 99.6 F (37.6 C) (11/20 1000) Pulse Rate:  [89-107] 103 (11/20 1000) Resp:  [15-19] 16 (11/20 1000) BP: (105-152)/(52-79) 137/71 mmHg (11/20 1000) SpO2:  [96 %-100 %] 96 % (11/20 1000) Weight:  [77.111 kg (170 lb)] 77.111 kg (170 lb) (11/19 1109) Last BM Date: 04/12/14 Diet: full liquids Tm 99.6, HR up some, BP stable LFT's up, and WBC is up.   No IOC - 105 from the drain Intake/Output from previous day: 11/19 0701 - 11/20 0700 In: 3677.9 [P.O.:90; I.V.:3537.9; IV Piggyback:50] Out: 2605 [Urine:2200; Drains:105] Intake/Output this shift: Total I/O In: 120 [P.O.:120] Out: 620 [Urine:600; Drains:20]  General appearance: alert, cooperative and no distress GI: sore, up walking  Lab Results:   Recent Labs  04/13/14 0421 04/14/14 0520  WBC 15.2* 21.2*  HGB 11.0* 10.7*  HCT 33.4* 34.3*  PLT 358 276    BMET  Recent Labs  04/13/14 0421 04/14/14 0520  NA 132* 136*  K 3.2* 3.8  CL 91* 96  CO2 29 27  GLUCOSE 168* 151*  BUN 7 8  CREATININE 0.54 0.61  CALCIUM 9.4 9.1   PT/INR No results for input(s): LABPROT, INR in the last 72 hours.   Recent Labs Lab 04/12/14 2255 04/13/14 0421 04/14/14 0520  AST 38* 321* 131*  ALT 38* 238* 236*  ALKPHOS 136* 206* 226*  BILITOT 0.9 1.6* 2.8*  PROT 8.0 8.4* 7.1  ALBUMIN 3.9 3.8 2.9*     Lipase     Component Value Date/Time   LIPASE 24 04/12/2014 2255     Studies/Results: US Abdomen Complete  04/13/2014   CLINICAL DATA:  Epigastric and right upper quadrant pain yesterday after eating cheese steak off of the food truck. Pain worsening throughout the day.  EXAM: ULTRASOUND ABDOMEN COMPLETE  COMPARISON:  None.  FINDINGS: Gallbladder: Gallbladder is mildly distended and diffusely filled with heterogeneous increased echotexture debris, likely indicating  sludge with small stones. Mild gallbladder wall thickening and edema. Murphy's sign is negative.  Common bile duct: Diameter: 2 mm, normal. Periportal and periductal tissues are thickened and echogenic suggesting inflammation. Query cholangitis.  Liver: No focal lesion identified. Within normal limits in parenchymal echogenicity.  IVC: No abnormality visualized.  Pancreas: Visualized portion unremarkable.  Spleen: Size and appearance within normal limits.  Right Kidney: Length: 11.3 cm. Echogenicity within normal limits. No mass or hydronephrosis visualized.  Left Kidney: Length: 10.8 cm. Echogenicity within normal limits. No mass or hydronephrosis visualized.  Abdominal aorta: No aneurysm visualized.  Other findings: None.  IMPRESSION: Gallbladder is distended and completely filled with echogenic material consistent with sludge and small stones. Mild gallbladder wall thickening and edema. Murphy's sign is negative. No bile duct dilatation. Periportal tissues appear inflamed.   Electronically Signed   By: Lucienne Capers M.D.   On: 04/13/2014 00:40   Mr 3d Recon At Scanner  04/13/2014   CLINICAL DATA:  Epigastric and right upper quadrant pain for 2 days. Gallstones. Evaluate for biliary obstruction.  EXAM: MRI ABDOMEN WITHOUT AND WITH CONTRAST (INCLUDING MRCP)  TECHNIQUE: Multiplanar multisequence MR imaging of the abdomen was performed both before and after the administration of intravenous contrast. Heavily T2-weighted images of the biliary and pancreatic ducts were obtained, and three-dimensional MRCP images were rendered by post processing.  CONTRAST:  38mL MULTIHANCE GADOBENATE DIMEGLUMINE 529 MG/ML IV  SOLN  COMPARISON:  04/12/2014 abdominal ultrasound  FINDINGS: Portions of exam are mildly motion degraded.  Lower chest: Mild cardiomegaly, without pericardial or pleural effusion.  Hepatobiliary: Hepatomegaly, 20.2 cm craniocaudal. Left hepatic lobe 1.4 cm lesion demonstrates moderate T2 hyperintensity on  image 32 of series 3. Early peripheral post-contrast enhancement with delayed central post-contrast enhancement. Favored to represent a minimally atypical hemangioma.  Inflammation extends into the porta hepatis and surrounds the portal triads. No intrahepatic biliary ductal dilatation. Hepatic and portal veins patent.  The gallbladder is distended with moderate wall thickening. Stones and sludge within. There is inflammation surrounding the gallbladder and extending into the pericolonic fat as well as medially into the porta hepatis.  The common duct is normal in caliber distally, 3 mm on image 29 of series 3. Poorly evaluated in the region of inflammation in the porta hepatis. Lack of proximal ductal dilatation argues against obstruction in this region.  Pancreas: Normal, without mass or pancreatic ductal dilatation.  Spleen: Normal  Adrenals/Urinary Tract: Normal adrenal glands. Normal kidneys, without hydronephrosis or hydroureter.  Stomach/Bowel: Normal stomach and small bowel loops. The ascending/ hepatic flexure the colon is displaced medially by the right upper quadrant inflammatory process.  Vascular/Lymphatic: No aneurysm. No abdominal adenopathy.  Other:  Trace ascites adjacent the tip of the liver.  Musculoskeletal: No focal osseous abnormality.  IMPRESSION: 1. Gallstones and sludge with moderate to marked inflammation centered about the gallbladder. Findings are most consistent with acute cholecystitis. 2. No evidence of choledocholithiasis or biliary ductal dilatation. The common duct is poorly evaluated in the region of porta hepatis inflammation, but no proximal dilatation is seen to suggest obstruction. 3. Trace perihepatic ascites. 4. Hepatomegaly. 5. Left hepatic lobe hemangioma. 6. Mild motion degradation.   Electronically Signed   By: Abigail Miyamoto M.D.   On: 04/13/2014 12:31   Mr Jeananne Rama W/wo Cm/mrcp  04/13/2014   CLINICAL DATA:  Epigastric and right upper quadrant pain for 2 days. Gallstones.  Evaluate for biliary obstruction.  EXAM: MRI ABDOMEN WITHOUT AND WITH CONTRAST (INCLUDING MRCP)  TECHNIQUE: Multiplanar multisequence MR imaging of the abdomen was performed both before and after the administration of intravenous contrast. Heavily T2-weighted images of the biliary and pancreatic ducts were obtained, and three-dimensional MRCP images were rendered by post processing.  CONTRAST:  19mL MULTIHANCE GADOBENATE DIMEGLUMINE 529 MG/ML IV SOLN  COMPARISON:  04/12/2014 abdominal ultrasound  FINDINGS: Portions of exam are mildly motion degraded.  Lower chest: Mild cardiomegaly, without pericardial or pleural effusion.  Hepatobiliary: Hepatomegaly, 20.2 cm craniocaudal. Left hepatic lobe 1.4 cm lesion demonstrates moderate T2 hyperintensity on image 32 of series 3. Early peripheral post-contrast enhancement with delayed central post-contrast enhancement. Favored to represent a minimally atypical hemangioma.  Inflammation extends into the porta hepatis and surrounds the portal triads. No intrahepatic biliary ductal dilatation. Hepatic and portal veins patent.  The gallbladder is distended with moderate wall thickening. Stones and sludge within. There is inflammation surrounding the gallbladder and extending into the pericolonic fat as well as medially into the porta hepatis.  The common duct is normal in caliber distally, 3 mm on image 29 of series 3. Poorly evaluated in the region of inflammation in the porta hepatis. Lack of proximal ductal dilatation argues against obstruction in this region.  Pancreas: Normal, without mass or pancreatic ductal dilatation.  Spleen: Normal  Adrenals/Urinary Tract: Normal adrenal glands. Normal kidneys, without hydronephrosis or hydroureter.  Stomach/Bowel: Normal stomach and small bowel loops. The ascending/ hepatic flexure the colon is  displaced medially by the right upper quadrant inflammatory process.  Vascular/Lymphatic: No aneurysm. No abdominal adenopathy.  Other:  Trace  ascites adjacent the tip of the liver.  Musculoskeletal: No focal osseous abnormality.  IMPRESSION: 1. Gallstones and sludge with moderate to marked inflammation centered about the gallbladder. Findings are most consistent with acute cholecystitis. 2. No evidence of choledocholithiasis or biliary ductal dilatation. The common duct is poorly evaluated in the region of porta hepatis inflammation, but no proximal dilatation is seen to suggest obstruction. 3. Trace perihepatic ascites. 4. Hepatomegaly. 5. Left hepatic lobe hemangioma. 6. Mild motion degradation.   Electronically Signed   By: Abigail Miyamoto M.D.   On: 04/13/2014 12:31    Medications: . escitalopram  10 mg Oral Daily  . heparin subcutaneous  5,000 Units Subcutaneous 3 times per day  . irbesartan  150 mg Oral Daily   And  . hydrochlorothiazide  25 mg Oral Daily  . Linaclotide  145 mcg Oral Daily  . piperacillin-tazobactam (ZOSYN)  IV  3.375 g Intravenous Q8H    Assessment/Plan Gangrenous cholecystitis Laparoscopic partial cholecystectomy with stone removal and placement of 19 blake drain, 04/13/14, Dr. Hassell Done  Rising LFT's Hx of arthritis  Hx of hypertension Hx of anxiety  Plan:  She is pretty sore after surgery, but much better than before surgery.  She is up walking with family now.  With LFT's up and WBC up we will continue antibiotics, and recheck labs in AM.   She is back on most of her home medicines.   LOS: 2 days    Sem Mccaughey 04/14/2014

## 2014-04-14 NOTE — Plan of Care (Signed)
Problem: Phase I Progression Outcomes Goal: Voiding-avoid urinary catheter unless indicated Outcome: Completed/Met Date Met:  04/14/14

## 2014-04-15 LAB — CBC
HEMATOCRIT: 29.9 % — AB (ref 36.0–46.0)
Hemoglobin: 9.6 g/dL — ABNORMAL LOW (ref 12.0–15.0)
MCH: 22 pg — ABNORMAL LOW (ref 26.0–34.0)
MCHC: 32.1 g/dL (ref 30.0–36.0)
MCV: 68.4 fL — AB (ref 78.0–100.0)
Platelets: 322 10*3/uL (ref 150–400)
RBC: 4.37 MIL/uL (ref 3.87–5.11)
RDW: 15.7 % — ABNORMAL HIGH (ref 11.5–15.5)
WBC: 11 10*3/uL — AB (ref 4.0–10.5)

## 2014-04-15 NOTE — Plan of Care (Signed)
Problem: Phase II Progression Outcomes Goal: Pain controlled Outcome: Completed/Met Date Met:  04/15/14 Goal: Vital signs stable Outcome: Completed/Met Date Met:  04/15/14 Goal: Dressings dry/intact Outcome: Completed/Met Date Met:  04/15/14

## 2014-04-15 NOTE — Plan of Care (Signed)
Problem: Phase II Progression Outcomes Goal: Surgical site without signs of infection Outcome: Completed/Met Date Met:  04/15/14     

## 2014-04-15 NOTE — Progress Notes (Signed)
Patient ID: Samantha Horton, female   DOB: 12-12-1960, 53 y.o.   MRN: [id number]  [facility] - Progress Note  POD# 2  Subjective: Patient ambulated in hall with husband this AM.  Mild pain.  Tolerating liquid diet.  Objective: Vital signs in last 24 hours: Temp:  [98 F (36.7 C)-99.6 F (37.6 C)] 98.4 F (36.9 C) (11/21 0628) Pulse Rate:  [86-103] 86 (11/21 0628) Resp:  [16-18] 18 (11/21 0628) BP: (102-137)/(67-86) 134/77 mmHg (11/21 0628) SpO2:  [95 %-99 %] 95 % (11/21 0628) Last BM Date: 04/12/14  Intake/Output from previous day: 11/20 0701 - 11/21 0700 In: 2760 [P.O.:360; I.V.:2400] Out: 2860 [Urine:2750; Drains:110]  Exam: HEENT - clear, not icteric Neck - soft Chest - clear bilaterally Cor - RRR, no murmur Abd - soft, BS present; JP drain with thin serosanguinous output Ext - no significant edema Neuro - grossly intact, no focal deficits  Lab Results:   Recent Labs  04/14/14 0520 04/15/14 0600  WBC 21.2* 11.0*  HGB 10.7* 9.6*  HCT 34.3* 29.9*  PLT 276 322     Recent Labs  04/14/14 0520 04/14/14 1136  NA 136* 135*  K 3.8 3.9  CL 96 94*  CO2 27 32  GLUCOSE 151* 112*  BUN 8 8  CREATININE 0.61 0.70  CALCIUM 9.1 9.2    Studies/Results: Mr 3d Recon At Scanner  04/13/2014   CLINICAL DATA:  Epigastric and right upper quadrant pain for 2 days. Gallstones. Evaluate for biliary obstruction.  EXAM: MRI ABDOMEN WITHOUT AND WITH CONTRAST (INCLUDING MRCP)  TECHNIQUE: Multiplanar multisequence MR imaging of the abdomen was performed both before and after the administration of intravenous contrast. Heavily T2-weighted images of the biliary and pancreatic ducts were obtained, and three-dimensional MRCP images were rendered by post processing.  CONTRAST:  49mL MULTIHANCE GADOBENATE DIMEGLUMINE 529 MG/ML IV SOLN  COMPARISON:  04/12/2014 abdominal ultrasound  FINDINGS: Portions of exam are mildly motion degraded.  Lower chest: Mild  cardiomegaly, without pericardial or pleural effusion.  Hepatobiliary: Hepatomegaly, 20.2 cm craniocaudal. Left hepatic lobe 1.4 cm lesion demonstrates moderate T2 hyperintensity on image 32 of series 3. Early peripheral post-contrast enhancement with delayed central post-contrast enhancement. Favored to represent a minimally atypical hemangioma.  Inflammation extends into the porta hepatis and surrounds the portal triads. No intrahepatic biliary ductal dilatation. Hepatic and portal veins patent.  The gallbladder is distended with moderate wall thickening. Stones and sludge within. There is inflammation surrounding the gallbladder and extending into the pericolonic fat as well as medially into the porta hepatis.  The common duct is normal in caliber distally, 3 mm on image 29 of series 3. Poorly evaluated in the region of inflammation in the porta hepatis. Lack of proximal ductal dilatation argues against obstruction in this region.  Pancreas: Normal, without mass or pancreatic ductal dilatation.  Spleen: Normal  Adrenals/Urinary Tract: Normal adrenal glands. Normal kidneys, without hydronephrosis or hydroureter.  Stomach/Bowel: Normal stomach and small bowel loops. The ascending/ hepatic flexure the colon is displaced medially by the right upper quadrant inflammatory process.  Vascular/Lymphatic: No aneurysm. No abdominal adenopathy.  Other:  Trace ascites adjacent the tip of the liver.  Musculoskeletal: No focal osseous abnormality.  IMPRESSION: 1. Gallstones and sludge with moderate to marked inflammation centered about the gallbladder. Findings are most consistent with acute cholecystitis. 2. No evidence of choledocholithiasis or biliary ductal dilatation. The common duct is poorly evaluated in the region of porta hepatis inflammation, but no proximal  dilatation is seen to suggest obstruction. 3. Trace perihepatic ascites. 4. Hepatomegaly. 5. Left hepatic lobe hemangioma. 6. Mild motion degradation.    Electronically Signed   By: Abigail Miyamoto M.D.   On: 04/13/2014 12:31   Mr Jeananne Rama W/wo Cm/mrcp  04/13/2014   CLINICAL DATA:  Epigastric and right upper quadrant pain for 2 days. Gallstones. Evaluate for biliary obstruction.  EXAM: MRI ABDOMEN WITHOUT AND WITH CONTRAST (INCLUDING MRCP)  TECHNIQUE: Multiplanar multisequence MR imaging of the abdomen was performed both before and after the administration of intravenous contrast. Heavily T2-weighted images of the biliary and pancreatic ducts were obtained, and three-dimensional MRCP images were rendered by post processing.  CONTRAST:  52mL MULTIHANCE GADOBENATE DIMEGLUMINE 529 MG/ML IV SOLN  COMPARISON:  04/12/2014 abdominal ultrasound  FINDINGS: Portions of exam are mildly motion degraded.  Lower chest: Mild cardiomegaly, without pericardial or pleural effusion.  Hepatobiliary: Hepatomegaly, 20.2 cm craniocaudal. Left hepatic lobe 1.4 cm lesion demonstrates moderate T2 hyperintensity on image 32 of series 3. Early peripheral post-contrast enhancement with delayed central post-contrast enhancement. Favored to represent a minimally atypical hemangioma.  Inflammation extends into the porta hepatis and surrounds the portal triads. No intrahepatic biliary ductal dilatation. Hepatic and portal veins patent.  The gallbladder is distended with moderate wall thickening. Stones and sludge within. There is inflammation surrounding the gallbladder and extending into the pericolonic fat as well as medially into the porta hepatis.  The common duct is normal in caliber distally, 3 mm on image 29 of series 3. Poorly evaluated in the region of inflammation in the porta hepatis. Lack of proximal ductal dilatation argues against obstruction in this region.  Pancreas: Normal, without mass or pancreatic ductal dilatation.  Spleen: Normal  Adrenals/Urinary Tract: Normal adrenal glands. Normal kidneys, without hydronephrosis or hydroureter.  Stomach/Bowel: Normal stomach and small bowel  loops. The ascending/ hepatic flexure the colon is displaced medially by the right upper quadrant inflammatory process.  Vascular/Lymphatic: No aneurysm. No abdominal adenopathy.  Other:  Trace ascites adjacent the tip of the liver.  Musculoskeletal: No focal osseous abnormality.  IMPRESSION: 1. Gallstones and sludge with moderate to marked inflammation centered about the gallbladder. Findings are most consistent with acute cholecystitis. 2. No evidence of choledocholithiasis or biliary ductal dilatation. The common duct is poorly evaluated in the region of porta hepatis inflammation, but no proximal dilatation is seen to suggest obstruction. 3. Trace perihepatic ascites. 4. Hepatomegaly. 5. Left hepatic lobe hemangioma. 6. Mild motion degradation.   Electronically Signed   By: Abigail Miyamoto M.D.   On: 04/13/2014 12:31    Assessment / Plan: 1.  Status post complex cholecystectomy  IV Zosyn  LFT's improving  WBC improving  Monitor drain output  Continue liquid diet at patient request today  OOB, ambulate  Earnstine Regal, MD, [facility] Office: [phone]  04/15/2014

## 2014-04-16 MED ORDER — TRAMADOL HCL 50 MG PO TABS
50.0000 mg | ORAL_TABLET | Freq: Four times a day (QID) | ORAL | Status: DC | PRN
  Administered 2014-04-16 – 2014-04-17 (×4): 50 mg via ORAL
  Filled 2014-04-16 (×4): qty 1

## 2014-04-16 NOTE — Plan of Care (Signed)
Problem: Phase III Progression Outcomes Goal: Voiding independently Outcome: Completed/Met Date Met:  04/16/14

## 2014-04-16 NOTE — Plan of Care (Signed)
Problem: Phase III Progression Outcomes Goal: Activity at appropriate level-compared to baseline (UP IN CHAIR FOR HEMODIALYSIS)  Outcome: Completed/Met Date Met:  04/16/14 Goal: Demonstrates TCDB, IS independently Outcome: Completed/Met Date Met:  04/16/14

## 2014-04-16 NOTE — Progress Notes (Signed)
Patient ID: Samantha Horton, female   DOB: 1961/01/05, 53 y.o.   MRN: [id number]  [facility] - Progress Note  POD# 3  Subjective: Patient up in chair, ambulated.  Hungry, wants to advance diet.  Small soft BM overnight.  Objective: Vital signs in last 24 hours: Temp:  [97.5 F (36.4 C)-98.5 F (36.9 C)] 98.5 F (36.9 C) (11/22 0602) Pulse Rate:  [87-92] 92 (11/22 0602) Resp:  [16-18] 18 (11/22 0602) BP: (126-132)/(57-78) 128/78 mmHg (11/22 0602) SpO2:  [98 %-99 %] 99 % (11/22 0602) Last BM Date: 04/15/14  Intake/Output from previous day: 11/21 0701 - 11/22 0700 In: 2287.5 [P.O.:840; I.V.:1347.5; IV Piggyback:100] Out: 1691 [Urine:1575; Drains:116]  Exam: HEENT - clear, not icteric Neck - soft Chest - clear bilaterally Cor - RRR, no murmur Abd - soft, JP drain with small thin serosanguinous output Ext - no significant edema Neuro - grossly intact, no focal deficits  Lab Results:   Recent Labs  04/14/14 0520 04/15/14 0600  WBC 21.2* 11.0*  HGB 10.7* 9.6*  HCT 34.3* 29.9*  PLT 276 322     Recent Labs  04/14/14 0520 04/14/14 1136  NA 136* 135*  K 3.8 3.9  CL 96 94*  CO2 27 32  GLUCOSE 151* 112*  BUN 8 8  CREATININE 0.61 0.70  CALCIUM 9.1 9.2    Studies/Results: No results found.  Assessment / Plan: 1.  Status post lap chole for necrotic gallbladder  Monitor drain output  Advance to regular diet  Check labs in AM  Begin po pain Rx  Earnstine Regal, MD, [facility] Office: [phone]  04/16/2014

## 2014-04-17 LAB — COMPREHENSIVE METABOLIC PANEL
ALBUMIN: 2.9 g/dL — AB (ref 3.5–5.2)
ALT: 71 U/L — ABNORMAL HIGH (ref 0–35)
ANION GAP: 13 (ref 5–15)
AST: 19 U/L (ref 0–37)
Alkaline Phosphatase: 171 U/L — ABNORMAL HIGH (ref 39–117)
BUN: 9 mg/dL (ref 6–23)
CALCIUM: 9.3 mg/dL (ref 8.4–10.5)
CO2: 28 mEq/L (ref 19–32)
Chloride: 97 mEq/L (ref 96–112)
Creatinine, Ser: 0.66 mg/dL (ref 0.50–1.10)
GFR calc non Af Amer: >90 mL/min (ref >90)
GLUCOSE: 120 mg/dL — AB (ref 70–99)
Potassium: 3.4 mEq/L — ABNORMAL LOW (ref 3.7–5.3)
SODIUM: 138 meq/L (ref 137–147)
Total Bilirubin: 0.5 mg/dL (ref 0.3–1.2)
Total Protein: 7 g/dL (ref 6.0–8.3)

## 2014-04-17 LAB — CBC
HCT: 29.7 % — ABNORMAL LOW (ref 36.0–46.0)
HEMOGLOBIN: 9.6 g/dL — AB (ref 12.0–15.0)
MCH: 22 pg — AB (ref 26.0–34.0)
MCHC: 32.3 g/dL (ref 30.0–36.0)
MCV: 68 fL — ABNORMAL LOW (ref 78.0–100.0)
Platelets: 397 10*3/uL (ref 150–400)
RBC: 4.37 MIL/uL (ref 3.87–5.11)
RDW: 15.4 % (ref 11.5–15.5)
WBC: 7.3 10*3/uL (ref 4.0–10.5)

## 2014-04-17 MED ORDER — AMOXICILLIN-POT CLAVULANATE 875-125 MG PO TABS
1.0000 | ORAL_TABLET | Freq: Two times a day (BID) | ORAL | Status: DC
  Administered 2014-04-17 – 2014-04-18 (×2): 1 via ORAL
  Filled 2014-04-17 (×3): qty 1

## 2014-04-17 MED ORDER — POTASSIUM CHLORIDE CRYS ER 10 MEQ PO TBCR
10.0000 meq | EXTENDED_RELEASE_TABLET | Freq: Two times a day (BID) | ORAL | Status: DC
  Administered 2014-04-17 – 2014-04-18 (×3): 10 meq via ORAL
  Filled 2014-04-17 (×5): qty 1

## 2014-04-17 MED ORDER — PSYLLIUM 95 % PO PACK
1.0000 | PACK | Freq: Every day | ORAL | Status: DC
  Filled 2014-04-17 (×2): qty 1

## 2014-04-17 NOTE — Plan of Care (Signed)
Problem: Phase III Progression Outcomes Goal: Pain controlled on oral analgesia Outcome: Completed/Met Date Met:  04/17/14     

## 2014-04-17 NOTE — Progress Notes (Signed)
4 Days Post-Op  Subjective: She is feeling better, doesn't like the food here, but tolerating diet.  Drain is clear and WBC is down  Objective: Vital signs in last 24 hours: Temp:  [98.1 F (36.7 C)-98.8 F (37.1 C)] 98.1 F (36.7 C) (11/23 0600) Pulse Rate:  [88-92] 88 (11/23 0600) Resp:  [16-18] 18 (11/23 0600) BP: (101-112)/(51-72) 109/51 mmHg (11/23 0600) SpO2:  [97 %-100 %] 97 % (11/23 0600) Last BM Date: 04/16/14 240 PO  Regular diet Afebrile, VSS K+ 3.4 wBC is down to normal Day 6 of antibiotics, Day 5 on Zosyn 50 ml from drain Intake/Output from previous day: 11/22 0701 - 11/23 0700 In: 1590 [P.O.:240; I.V.:1200; IV Piggyback:150] Out: 1950 [Urine:1900; Drains:50] Intake/Output this shift: Total I/O In: 200 [I.V.:200] Out: 250 [Urine:250]  General appearance: alert, cooperative and no distress Resp: clear to auscultation bilaterally GI: soft, sore, sites look fine.  +flatus and BM.  Lab Results:   Recent Labs  04/15/14 0600 04/17/14 0425  WBC 11.0* 7.3  HGB 9.6* 9.6*  HCT 29.9* 29.7*  PLT 322 397    BMET  Recent Labs  04/17/14 0425  NA 138  K 3.4*  CL 97  CO2 28  GLUCOSE 120*  BUN 9  CREATININE 0.66  CALCIUM 9.3   PT/INR No results for input(s): LABPROT, INR in the last 72 hours.   Recent Labs Lab 04/12/14 2255 04/13/14 0421 04/14/14 0520 04/14/14 1136 04/17/14 0425  AST 38* 321* 131* 83* 19  ALT 38* 238* 236* 201* 71*  ALKPHOS 136* 206* 226* 211* 171*  BILITOT 0.9 1.6* 2.8* 1.7* 0.5  PROT 8.0 8.4* 7.1 7.1 7.0  ALBUMIN 3.9 3.8 2.9* 2.9* 2.9*     Lipase     Component Value Date/Time   LIPASE 24 04/12/2014 2255     Studies/Results: No results found.  Medications: . azelastine  2 spray Each Nare Daily  . escitalopram  10 mg Oral Daily  . heparin subcutaneous  5,000 Units Subcutaneous 3 times per day  . irbesartan  150 mg Oral Daily   And  . hydrochlorothiazide  25 mg Oral Daily  . Linaclotide  145 mcg Oral Daily  .  piperacillin-tazobactam (ZOSYN)  IV  3.375 g Intravenous Q8H  . potassium chloride  10 mEq Oral BID WC    Assessment/Plan Gangrenous cholecystitis  Laparoscopic partial cholecystectomy with stone removal and placement of 19 blake drain, 04/13/14, Dr. Hassell Done  Rising LFT's Hx of arthritis  Hx of hypertension Hx of anxiety   Plan:  Saline lock IV, change to Augmentin,  Check on pulling the drain and possible d/c in AM.   LOS: 5 days   JENNINGS,WILLARD 04/17/2014  Agree with above. In the room by herself. Eating supper.  Looks good.  Should be able to go home tomorrow.  Alphonsa Overall, MD, [facility] Pager: [phone] Office phone:  [phone]

## 2014-04-17 NOTE — Discharge Summary (Signed)
Physician Discharge Summary  Patient ID: Samantha Horton MRN: [id number] DOB/AGE: Nov 03, 1960 53 y.o.  Admit date: 04/12/2014 Discharge date: 04/18/2014  Admission Diagnoses:  Acute cholecystitis Rising LFT's Hx of arthritis  Hx of hypertension Hx of anxiety Discharge Diagnoses:  Gangrenous cholecystitis Rising LFT's Hx of arthritis  Hx of hypertension Hx of anxiety  Principal Problem:   S/P partial cholecystectomy Nov 2015 Active Problems:   Gangrene of gallbladder   PROCEDURES: Laparoscopic partial cholecystectomy with stone removal and placement of 19 blake drain, 04/13/2014, Pedro Earls, MD.  Hospital Course: Samantha Horton is a 53 y.o. female who presented to the [facility] complaining of constant, cramping abdominal pain that started Tues. This is associated with nausea and general malaise. Pt denies history of similar symptoms and EtOH use. Her last BM was this morning and was normal. Pt denies vomiting or fevers. Because of her rising LFT's an MRCP was obtained.  This showed:  Gallstones and sludge with moderate to marked inflammation centered about the gallbladder. Findings are most consistent with acute cholecystitis. No evidence of choledocholithiasis or biliary ductal dilatation. The common duct is poorly evaluated in the region of porta hepatis inflammation, but no proximal dilatation is seen to suggest obstruction. Trace perihepatic ascites. She was taken to the OR later that day and found to have a gangrenous gallbladder.  She was very difficult, but procedure was completed and she has done well post op.  Drain was clear and removed. On 04/17/14.  LFT's improved. She was kept on Zosyn from 11/19 thru 11/23 for 4.5 days.  We have switched her to Augmentin.  Her WBC has returned to normal and we hope to discharge her home on 04/18/14.  She will have 2 more days of antibiotics, after discharge.    Condition on d/c:  Improved  Disposition: 01-Home  or Self Care     Medication List    TAKE these medications        acetaminophen 325 MG tablet  Commonly known as:  TYLENOL  Take 2 tablets (650 mg total) by mouth every 6 (six) hours as needed for mild pain, moderate pain, fever or headache.     amoxicillin-clavulanate 875-125 MG per tablet  Commonly known as:  AUGMENTIN  Take 1 tablet by mouth every 12 (twelve) hours.     azelastine 0.1 % nasal spray  Commonly known as:  ASTELIN  Place 2 sprays into the nose daily. Use in each nostril as directed     dicyclomine 20 MG tablet  Commonly known as:  BENTYL  Take 20 mg by mouth every 6 (six) hours.     DIOVAN HCT 160-25 MG per tablet  Generic drug:  valsartan-hydrochlorothiazide  Take 1 tablet by mouth Daily.     escitalopram 10 MG tablet  Commonly known as:  LEXAPRO  Take 10 mg by mouth daily.     INTEGRA F 125-1 MG Caps  Take 1 capsule by mouth daily.     LINZESS 145 MCG Caps capsule  Generic drug:  Linaclotide  Take 145 mcg by mouth daily.     potassium chloride 10 MEQ tablet  Commonly known as:  K-DUR,KLOR-CON  Take two tablets daily     promethazine 25 MG tablet  Commonly known as:  PHENERGAN  Take 25 mg by mouth every 6 (six) hours as needed for nausea or vomiting.     psyllium 95 % Pack  Commonly known as:  HYDROCIL/METAMUCIL  Increase your fiber with  this or other product of your choice.     traMADol 50 MG tablet  Commonly known as:  ULTRAM  Take 1-2 tablets (50-100 mg total) by mouth every 6 (six) hours as needed for moderate pain.       Follow-up Information    Follow up with Johnathan Hausen B, MD. Schedule an appointment as soon as possible for a visit in 3 weeks.   Specialty:  General Surgery   Why:  Call for an appointment with Dr. Hassell Done in 2-3 weeks.  You can return to work after he see you.   Contact information:   [place] [facility] [place] [address] [phone]       Signed: Earnstine Regal 04/18/2014, 3:00 PM  Agree  with above.  Alphonsa Overall, MD, [facility] Pager: [phone] Office phone:  [phone]

## 2014-04-17 NOTE — Discharge Instructions (Signed)
Laparoscopic Cholecystectomy, Care After °Refer to this sheet in the next few weeks. These instructions provide you with information on caring for yourself after your procedure. Your health care provider may also give you more specific instructions. Your treatment has been planned according to current medical practices, but problems sometimes occur. Call your health care provider if you have any problems or questions after your procedure. °WHAT TO EXPECT AFTER THE PROCEDURE °After your procedure, it is typical to have the following: °· Pain at your incision sites. You will be given pain medicines to control the pain. °· Mild nausea or vomiting. This should improve after the first 24 hours. °· Bloating and possibly shoulder pain from the gas used during the procedure. This will improve after the first 24 hours. °HOME CARE INSTRUCTIONS  °· Change bandages (dressings) as directed by your health care provider. °· Keep the wound dry and clean. You may wash the wound gently with soap and water. Gently blot or dab the area dry. °· Do not take baths or use swimming pools or hot tubs for 2 weeks or until your health care provider approves. °· Only take over-the-counter or prescription medicines as directed by your health care provider. °· Continue your normal diet as directed by your health care provider. °· Do not lift anything heavier than 10 pounds (4.5 kg) until your health care provider approves. °· Do not play contact sports for 1 week or until your health care provider approves. °SEEK MEDICAL CARE IF:  °· You have redness, swelling, or increasing pain in the wound. °· You notice yellowish-white fluid (pus) coming from the wound. °· You have drainage from the wound that lasts longer than 1 day. °· You notice a bad smell coming from the wound or dressing. °· Your surgical cuts (incisions) break open. °SEEK IMMEDIATE MEDICAL CARE IF:  °· You develop a rash. °· You have difficulty breathing. °· You have chest pain. °· You  have a fever. °· You have increasing pain in the shoulders (shoulder strap areas). °· You have dizzy episodes or faint while standing. °· You have severe abdominal pain. °· You feel sick to your stomach (nauseous) or throw up (vomit) and this lasts for more than 1 day. °Document Released: 05/12/2005 Document Revised: 03/02/2013 Document Reviewed: 12/22/2012 °Exit[employer]® Patient Information ©2015 [employer]. This information is not intended to replace advice given to you by your health care provider. Make sure you discuss any questions you have with your health care provider. ° °CCS ______[facility] °LAPAROSCOPIC SURGERY: POST OP INSTRUCTIONS °Always review your discharge instruction sheet given to you by the facility where your surgery was performed. °IF YOU HAVE DISABILITY OR FAMILY LEAVE FORMS, YOU MUST BRING THEM TO THE OFFICE FOR PROCESSING.   °DO NOT GIVE THEM TO YOUR DOCTOR. ° °1. A prescription for pain medication may be given to you upon discharge.  Take your pain medication as prescribed, if needed.  If narcotic pain medicine is not needed, then you may take acetaminophen (Tylenol) or ibuprofen (Advil) as needed. °2. Take your usually prescribed medications unless otherwise directed. °3. If you need a refill on your pain medication, please contact your pharmacy.  They will contact our office to request authorization. Prescriptions will not be filled after 5pm or on week-ends. °4. You should follow a light diet the first few days after arrival home, such as soup and crackers, etc.  Be sure to include lots of fluids daily. °5. Most patients will experience some   swelling and bruising in the area of the incisions.  Ice packs will help.  Swelling and bruising can take several days to resolve.  °6. It is common to experience some constipation if taking pain medication after surgery.  Increasing fluid intake and taking a stool softener (such as Colace) will usually help or prevent this problem  from occurring.  A mild laxative (Milk of Magnesia or Miralax) should be taken according to package instructions if there are no bowel movements after 48 hours. °7. Unless discharge instructions indicate otherwise, you may remove your bandages 24-48 hours after surgery, and you may shower at that time.  You may have steri-strips (small skin tapes) in place directly over the incision.  These strips should be left on the skin for 7-10 days.  If your surgeon used skin glue on the incision, you may shower in 24 hours.  The glue will flake off over the next 2-3 weeks.  Any sutures or staples will be removed at the office during your follow-up visit. °8. ACTIVITIES:  You may resume regular (light) daily activities beginning the next day--such as daily self-care, walking, climbing stairs--gradually increasing activities as tolerated.  You may have sexual intercourse when it is comfortable.  Refrain from any heavy lifting or straining until approved by your doctor. °a. You may drive when you are no longer taking prescription pain medication, you can comfortably wear a seatbelt, and you can safely maneuver your car and apply brakes. °b. RETURN TO WORK:  __________________________________________________________ °9. You should see your doctor in the office for a follow-up appointment approximately 2-3 weeks after your surgery.  Make sure that you call for this appointment within a day or two after you arrive home to insure a convenient appointment time. °10. OTHER INSTRUCTIONS: __________________________________________________________________________________________________________________________ __________________________________________________________________________________________________________________________ °WHEN TO CALL YOUR DOCTOR: °1. Fever over 101.0 °2. Inability to urinate °3. Continued bleeding from incision. °4. Increased pain, redness, or drainage from the incision. °5. Increasing abdominal pain ° °The  clinic staff is available to answer your questions during regular business hours.  Please don’t hesitate to call and ask to speak to one of the nurses for clinical concerns.  If you have a medical emergency, go to the nearest emergency room or call 911.  A surgeon from [facility] is always on call at the hospital. °[address], [address], [place], [place]  [address] ? [address] [address], [place], [place]   [address] °[phone] ? [phone] ? FAX [phone] °Web site: [email or web address] °

## 2014-04-18 LAB — CBC
HEMATOCRIT: 31 % — AB (ref 36.0–46.0)
HEMOGLOBIN: 10 g/dL — AB (ref 12.0–15.0)
MCH: 22.1 pg — ABNORMAL LOW (ref 26.0–34.0)
MCHC: 32.3 g/dL (ref 30.0–36.0)
MCV: 68.4 fL — ABNORMAL LOW (ref 78.0–100.0)
Platelets: 424 10*3/uL — ABNORMAL HIGH (ref 150–400)
RBC: 4.53 MIL/uL (ref 3.87–5.11)
RDW: 15.6 % — ABNORMAL HIGH (ref 11.5–15.5)
WBC: 7.6 10*3/uL (ref 4.0–10.5)

## 2014-04-18 MED ORDER — PSYLLIUM 95 % PO PACK: PACK | ORAL | Status: DC

## 2014-04-18 MED ORDER — ACETAMINOPHEN 325 MG PO TABS: 650.0000 mg | ORAL_TABLET | Freq: Four times a day (QID) | ORAL | Status: DC | PRN

## 2014-04-18 MED ORDER — AMOXICILLIN-POT CLAVULANATE 875-125 MG PO TABS: 1.0000 | ORAL_TABLET | Freq: Two times a day (BID) | ORAL | Status: DC

## 2014-04-18 MED ORDER — TRAMADOL HCL 50 MG PO TABS: 50.0000 mg | ORAL_TABLET | Freq: Four times a day (QID) | ORAL | Status: DC | PRN

## 2014-04-18 NOTE — Plan of Care (Signed)
Problem: Phase III Progression Outcomes Goal: IV changed to normal saline lock Outcome: Completed/Met Date Met:  04/18/14 Goal: Discharge plan remains appropriate-arrangements made Outcome: Completed/Met Date Met:  04/18/14 Goal: Other Phase III Outcomes/Goals Outcome: Not Applicable Date Met:  04/18/14  Problem: Discharge Progression Outcomes Goal: Barriers To Progression Addressed/Resolved Outcome: Completed/Met Date Met:  04/18/14 Goal: Discharge plan in place and appropriate Outcome: Completed/Met Date Met:  04/18/14 Goal: Pain controlled with appropriate interventions Outcome: Completed/Met Date Met:  04/18/14 Goal: Hemodynamically stable Outcome: Completed/Met Date Met:  04/18/14 Goal: Complications resolved/controlled Outcome: Completed/Met Date Met:  04/18/14 Goal: Tolerating diet Outcome: Completed/Met Date Met:  04/18/14 Goal: Activity appropriate for discharge plan Outcome: Completed/Met Date Met:  04/18/14 Goal: Tubes and drains discontinued if indicated Outcome: Completed/Met Date Met:  04/18/14 Goal: Steri-Strips applied Outcome: Completed/Met Date Met:  04/18/14 Goal: Other Discharge Outcomes/Goals Outcome: Not Applicable Date Met:  04/18/14     

## 2014-04-18 NOTE — Progress Notes (Signed)
Discharge instructions reviewed with patient, prescription given, patient to follow up with MD with 2 weeks, incisions are within normal limits and jp removed, vital signs are stable, questions and concerns answered Neta Mends RN 1:05 PM 04-18-2014

## 2014-04-18 NOTE — Progress Notes (Signed)
5 Days Post-Op  Subjective: She looks good, eating, tolerating diet and ambulating well.  Objective: Vital signs in last 24 hours: Temp:  [97.8 F (36.6 C)-98.7 F (37.1 C)] 98.1 F (36.7 C) (11/24 1006) Pulse Rate:  [84-90] 86 (11/24 1006) Resp:  [16-18] 16 (11/24 1006) BP: (91-125)/(51-68) 113/54 mmHg (11/24 1006) SpO2:  [97 %-100 %] 98 % (11/24 1006) Last BM Date: 04/16/14 Regular diet Afebrile, VSS WBC remains normal Intake/Output from previous day: 11/23 0701 - 11/24 0700 In: 1760 [P.O.:1560; I.V.:200] Out: 3070 [Urine:2950; Drains:120] Intake/Output this shift: Total I/O In: 200 [P.O.:200] Out: 150 [Urine:150]  General appearance: alert, cooperative and no distress Resp: clear to auscultation bilaterally GI: soft sore, sites look fine.  No drainage from drain removed last PM.  Lab Results:   Recent Labs  04/17/14 0425 04/18/14 0445  WBC 7.3 7.6  HGB 9.6* 10.0*  HCT 29.7* 31.0*  PLT 397 424*    BMET  Recent Labs  04/17/14 0425  NA 138  K 3.4*  CL 97  CO2 28  GLUCOSE 120*  BUN 9  CREATININE 0.66  CALCIUM 9.3   PT/INR No results for input(s): LABPROT, INR in the last 72 hours.   Recent Labs Lab 04/12/14 2255 04/13/14 0421 04/14/14 0520 04/14/14 1136 04/17/14 0425  AST 38* 321* 131* 83* 19  ALT 38* 238* 236* 201* 71*  ALKPHOS 136* 206* 226* 211* 171*  BILITOT 0.9 1.6* 2.8* 1.7* 0.5  PROT 8.0 8.4* 7.1 7.1 7.0  ALBUMIN 3.9 3.8 2.9* 2.9* 2.9*     Lipase     Component Value Date/Time   LIPASE 24 04/12/2014 2255     Studies/Results: No results found.  Medications: . amoxicillin-clavulanate  1 tablet Oral Q12H  . azelastine  2 spray Each Nare Daily  . escitalopram  10 mg Oral Daily  . heparin subcutaneous  5,000 Units Subcutaneous 3 times per day  . irbesartan  150 mg Oral Daily   And  . hydrochlorothiazide  25 mg Oral Daily  . Linaclotide  145 mcg Oral Daily  . potassium chloride  10 mEq Oral BID WC  . psyllium  1 packet Oral  QHS    Assessment/Plan Gangrenous cholecystitis Laparoscopic partial cholecystectomy with stone removal and placement of 19 blake drain, 04/13/14, Dr. Hassell Done  Rising LFT's Hx of arthritis  Hx of hypertension Hx of anxiety   Plan:  Home today follow up with Dr. Hassell Done in 2-3 weeks.   LOS: 6 days    JENNINGS,WILLARD 04/18/2014  Agree with above.  Alphonsa Overall, MD, [facility] Pager: [phone] Office phone:  [phone]

## 2014-04-28 ENCOUNTER — Encounter (HOSPITAL_BASED_OUTPATIENT_CLINIC_OR_DEPARTMENT_OTHER): Payer: Self-pay | Admitting: Emergency Medicine

## 2014-04-28 ENCOUNTER — Emergency Department (HOSPITAL_BASED_OUTPATIENT_CLINIC_OR_DEPARTMENT_OTHER)
Admission: EM | Admit: 2014-04-28 | Discharge: 2014-04-28 | Disposition: A | Discharge status: Home / Self Care | Payer: Commercial Managed Care - PPO | Attending: Emergency Medicine | Admitting: Emergency Medicine

## 2014-04-28 DIAGNOSIS — R51 Headache: Secondary | ICD-10-CM | POA: Insufficient documentation

## 2014-04-28 DIAGNOSIS — E876 Hypokalemia: Secondary | ICD-10-CM | POA: Diagnosis not present

## 2014-04-28 DIAGNOSIS — Z792 Long term (current) use of antibiotics: Secondary | ICD-10-CM | POA: Insufficient documentation

## 2014-04-28 DIAGNOSIS — D509 Iron deficiency anemia, unspecified: Secondary | ICD-10-CM | POA: Insufficient documentation

## 2014-04-28 DIAGNOSIS — M1990 Unspecified osteoarthritis, unspecified site: Secondary | ICD-10-CM | POA: Diagnosis not present

## 2014-04-28 DIAGNOSIS — Z79899 Other long term (current) drug therapy: Secondary | ICD-10-CM | POA: Insufficient documentation

## 2014-04-28 DIAGNOSIS — F419 Anxiety disorder, unspecified: Secondary | ICD-10-CM | POA: Insufficient documentation

## 2014-04-28 DIAGNOSIS — I10 Essential (primary) hypertension: Secondary | ICD-10-CM | POA: Insufficient documentation

## 2014-04-28 DIAGNOSIS — Z5731 Occupational exposure to environmental tobacco smoke: Secondary | ICD-10-CM | POA: Diagnosis not present

## 2014-04-28 DIAGNOSIS — R0981 Nasal congestion: Secondary | ICD-10-CM | POA: Diagnosis not present

## 2014-04-28 LAB — CBC WITH DIFFERENTIAL/PLATELET
BASOS PCT: 1 % (ref 0–1)
Basophils Absolute: 0.1 10*3/uL (ref 0.0–0.1)
Eosinophils Absolute: 0.1 10*3/uL (ref 0.0–0.7)
Eosinophils Relative: 2 % (ref 0–5)
HEMATOCRIT: 30.8 % — AB (ref 36.0–46.0)
Hemoglobin: 10.1 g/dL — ABNORMAL LOW (ref 12.0–15.0)
LYMPHS ABS: 1.5 10*3/uL (ref 0.7–4.0)
Lymphocytes Relative: 26 % (ref 12–46)
MCH: 22.2 pg — ABNORMAL LOW (ref 26.0–34.0)
MCHC: 32.8 g/dL (ref 30.0–36.0)
MCV: 67.8 fL — ABNORMAL LOW (ref 78.0–100.0)
MONOS PCT: 9 % (ref 3–12)
Monocytes Absolute: 0.5 10*3/uL (ref 0.1–1.0)
NEUTROS ABS: 3.5 10*3/uL (ref 1.7–7.7)
Neutrophils Relative %: 62 % (ref 43–77)
Platelets: 635 10*3/uL — ABNORMAL HIGH (ref 150–400)
RBC: 4.54 MIL/uL (ref 3.87–5.11)
RDW: 16 % — ABNORMAL HIGH (ref 11.5–15.5)
WBC: 5.7 10*3/uL (ref 4.0–10.5)

## 2014-04-28 LAB — BASIC METABOLIC PANEL
Anion gap: 15 (ref 5–15)
BUN: 10 mg/dL (ref 6–23)
CO2: 27 mEq/L (ref 19–32)
CREATININE: 0.6 mg/dL (ref 0.50–1.10)
Calcium: 9.3 mg/dL (ref 8.4–10.5)
Chloride: 100 mEq/L (ref 96–112)
GFR calc Af Amer: >90 mL/min (ref >90)
GFR calc non Af Amer: >90 mL/min (ref >90)
Glucose, Bld: 90 mg/dL (ref 70–99)
Potassium: 3.2 mEq/L — ABNORMAL LOW (ref 3.7–5.3)
Sodium: 142 mEq/L (ref 137–147)

## 2014-04-28 MED ORDER — POTASSIUM CHLORIDE CRYS ER 20 MEQ PO TBCR
40.0000 meq | EXTENDED_RELEASE_TABLET | Freq: Once | ORAL | Status: AC
  Administered 2014-04-28: 40 meq via ORAL

## 2014-04-28 NOTE — Discharge Instructions (Signed)
Hypokalemia Hypokalemia means that the amount of potassium in the blood is lower than normal.Potassium is a chemical, called an electrolyte, that helps regulate the amount of fluid in the body. It also stimulates muscle contraction and helps nerves function properly.Most of the body's potassium is inside of cells, and only a very small amount is in the blood. Because the amount in the blood is so small, minor changes can be life-threatening. CAUSES  Antibiotics.  Diarrhea or vomiting.  Using laxatives too much, which can cause diarrhea.  Chronic kidney disease.  Water pills (diuretics).  Eating disorders (bulimia).  Low magnesium level.  Sweating a lot. SIGNS AND SYMPTOMS  Weakness.  Constipation.  Fatigue.  Muscle cramps.  Mental confusion.  Skipped heartbeats or irregular heartbeat (palpitations).  Tingling or numbness. DIAGNOSIS  Your health care provider can diagnose hypokalemia with blood tests. In addition to checking your potassium level, your health care provider may also check other lab tests. TREATMENT Hypokalemia can be treated with potassium supplements taken by mouth or adjustments in your current medicines. If your potassium level is very low, you may need to get potassium through a vein (IV) and be monitored in the hospital. A diet high in potassium is also helpful. Foods high in potassium are:  Nuts, such as peanuts and pistachios.  Seeds, such as sunflower seeds and pumpkin seeds.  Peas, lentils, and lima beans.  Whole grain and bran cereals and breads.  Fresh fruit and vegetables, such as apricots, avocado, bananas, cantaloupe, kiwi, oranges, tomatoes, asparagus, and potatoes.  Orange and tomato juices.  Red meats.  Fruit yogurt. HOME CARE INSTRUCTIONS  Take all medicines as prescribed by your health care provider.  Maintain a healthy diet by including nutritious food, such as fruits, vegetables, nuts, whole grains, and lean meats.  If  you are taking a laxative, be sure to follow the directions on the label. SEEK MEDICAL CARE IF:  Your weakness gets worse.  You feel your heart pounding or racing.  You are vomiting or having diarrhea.  You are diabetic and having trouble keeping your blood glucose in the normal range. SEEK IMMEDIATE MEDICAL CARE IF:  You have chest pain, shortness of breath, or dizziness.  You are vomiting or having diarrhea for more than 2 days.  You faint. MAKE SURE YOU:   Understand these instructions.  Will watch your condition.  Will get help right away if you are not doing well or get worse. Document Released: 05/12/2005 Document Revised: 03/02/2013 Document Reviewed: 11/12/2012 [facility] Patient Information 2015 [place], [place]. This information is not intended to replace advice given to you by your health care provider. Make sure you discuss any questions you have with your health care provider.  Iron Deficiency Anemia Anemia is a condition in which there are less red blood cells or hemoglobin in the blood than normal. Hemoglobin is the part of red blood cells that carries oxygen. Iron deficiency anemia is anemia caused by too little iron. It is the most common type of anemia. It may leave you tired and short of breath. CAUSES   Lack of iron in the diet.  Poor absorption of iron, as seen with intestinal disorders.  Intestinal bleeding.  Heavy periods. SIGNS AND SYMPTOMS  Mild anemia may not be noticeable. Symptoms may include:  Fatigue.  Headache.  Pale skin.  Weakness.  Tiredness.  Shortness of breath.  Dizziness.  Cold hands and feet.  Fast or irregular heartbeat. DIAGNOSIS  Diagnosis requires a thorough evaluation and  physical exam by your health care provider. Blood tests are generally used to confirm iron deficiency anemia. Additional tests may be done to find the underlying cause of your anemia. These may include:  Testing for blood in the stool (fecal occult  blood test).  A procedure to see inside the colon and rectum (colonoscopy).  A procedure to see inside the esophagus and stomach (endoscopy). TREATMENT  Iron deficiency anemia is treated by correcting the cause of the deficiency. Treatment may involve:  Adding iron-rich foods to your diet.  Taking iron supplements. Pregnant or breastfeeding women need to take extra iron because their normal diet usually does not provide the required amount.  Taking vitamins. Vitamin C improves the absorption of iron. Your health care provider may recommend that you take your iron tablets with a glass of orange juice or vitamin C supplement.  Medicines to make heavy menstrual flow lighter.  Surgery. HOME CARE INSTRUCTIONS   Take iron as directed by your health care provider.  If you cannot tolerate taking iron supplements by mouth, talk to your health care provider about taking them through a vein (intravenously) or an injection into a muscle.  For the best iron absorption, iron supplements should be taken on an empty stomach. If you cannot tolerate them on an empty stomach, you may need to take them with food.  Do not drink milk or take antacids at the same time as your iron supplements. Milk and antacids may interfere with the absorption of iron.  Iron supplements can cause constipation. Make sure to include fiber in your diet to prevent constipation. A stool softener may also be recommended.  Take vitamins as directed by your health care provider.  Eat a diet rich in iron. Foods high in iron include liver, lean beef, whole-grain bread, eggs, dried fruit, and dark green leafy vegetables. SEEK IMMEDIATE MEDICAL CARE IF:   You faint. If this happens, do not drive. Call your local emergency services (911 in [place] if no other help is available.  You have chest pain.  You feel nauseous or vomit.  You have severe or increased shortness of breath with activity.  You feel weak.  You have a rapid  heartbeat.  You have unexplained sweating.  You become light-headed when getting up from a chair or bed. MAKE SURE YOU:   Understand these instructions.  Will watch your condition.  Will get help right away if you are not doing well or get worse. Document Released: 05/09/2000 Document Revised: 05/17/2013 Document Reviewed: 01/17/2013 [facility] Patient Information 2015 [place], [place]. This information is not intended to replace advice given to you by your health care provider. Make sure you discuss any questions you have with your health care provider.

## 2014-04-28 NOTE — ED Notes (Signed)
Pt states she has stopped taking iron supplements for gallbladder surgery.  Pt feels like her count is low and requests to have checked.  Pt also c/o some sinus pressure.

## 2014-04-28 NOTE — ED Provider Notes (Signed)
CSN: [id number]     Arrival date & time 04/28/14  6283 History   First MD Initiated Contact with Patient 04/28/14 1120     Chief Complaint  Patient presents with  . Labs Only     (Consider location/radiation/quality/duration/timing/severity/associated sxs/prior Treatment) HPI Comments: Pt presents to have her labs checked. She has a history of anemia and she had recent gallbladder surgery. They took her off of her iron supplements after the surgery he has a thought it would upset her stomach. She feels like she may have some low iron levels. She's had a little bit a headache for the last 2 days. Sometimes she gets this with her low iron levels. She denies any headache now. She has a little bit of sinus congestion. She otherwise feels fine. She doesn't have any increased fatigue. She denies any fevers. She denies any dizziness. She feels like her abdominal surgeries doing well. She denies any increased abdominal pain and vomiting.   Past Medical History  Diagnosis Date  . Anemia   . Arthritis   . Headache(784.0)     history of migraine per pt  . Allergy     allergic rhinitis  . Hypertension   . Anxiety    Past Surgical History  Procedure Laterality Date  . Knee surgery  12/2006    Right  . Endometrial ablation    . Breast surgery    . Cholecystectomy N/A 04/13/2014    Procedure: LAPAROSCOPIC PARTIAL CHOLECYSTECTOMY WITH STONE REMOVAL;  Surgeon: Pedro Earls, MD;  Location: [facility];  Service: General;  Laterality: N/A;   Family History  Problem Relation Age of Onset  . Hypertension Mother   . Migraines Mother   . Arthritis Mother   . Hypertension Father    History  Substance Use Topics  . Smoking status: Never Smoker   . Smokeless tobacco: Never Used  . Alcohol Use: No   OB History    No data available     Review of Systems  Constitutional: Negative for fever, chills, diaphoresis and fatigue.  HENT: Positive for congestion and rhinorrhea. Negative for sneezing.    Eyes: Negative.   Respiratory: Negative for cough, chest tightness and shortness of breath.   Cardiovascular: Negative for chest pain and leg swelling.  Gastrointestinal: Negative for nausea, vomiting, abdominal pain, diarrhea and blood in stool.  Genitourinary: Negative for frequency, hematuria, flank pain and difficulty urinating.  Musculoskeletal: Negative for back pain and arthralgias.  Skin: Negative for rash.  Neurological: Positive for headaches. Negative for dizziness, speech difficulty, weakness and numbness.      Allergies  Codeine and Vicodin  Home Medications   Prior to Admission medications   Medication Sig Start Date End Date Taking? Authorizing Provider  amoxicillin-clavulanate (AUGMENTIN) 875-125 MG per tablet Take 1 tablet by mouth every 12 (twelve) hours. 04/18/14  Yes Earnstine Regal, PA-C  Fe Fum-FePoly-FA-Vit C-Vit B3 (INTEGRA F) 125-1 MG CAPS Take 1 capsule by mouth daily. 08/12/12  Yes Lanice Shirts, MD  traMADol (ULTRAM) 50 MG tablet Take 1-2 tablets (50-100 mg total) by mouth every 6 (six) hours as needed for moderate pain. 04/18/14  Yes Earnstine Regal, PA-C  acetaminophen (TYLENOL) 325 MG tablet Take 2 tablets (650 mg total) by mouth every 6 (six) hours as needed for mild pain, moderate pain, fever or headache. 04/18/14   Earnstine Regal, PA-C  azelastine (ASTELIN) 137 MCG/SPRAY nasal spray Place 2 sprays into the nose daily. Use in each nostril as directed  Historical Provider, MD  DIOVAN HCT 160-25 MG per tablet Take 1 tablet by mouth Daily. 12/07/10   Historical Provider, MD  escitalopram (LEXAPRO) 10 MG tablet Take 10 mg by mouth daily.    Historical Provider, MD  Linaclotide Rolan Lipa) 145 MCG CAPS Take 145 mcg by mouth daily.     Historical Provider, MD  potassium chloride (K-DUR,KLOR-CON) 10 MEQ tablet Take two tablets daily 04/07/13   Lanice Shirts, MD  psyllium (HYDROCIL/METAMUCIL) 95 % PACK Increase your fiber with this or other  product of your choice. 04/18/14   Earnstine Regal, PA-C   BP 136/80 mmHg  Pulse 80  Temp(Src) 98.3 F (36.8 C)  Resp 16  Ht 5\' 1"  (1.549 m)  Wt 164 lb (74.39 kg)  BMI 31.00 kg/m2  SpO2 98% Physical Exam  Constitutional: She is oriented to person, place, and time. She appears well-developed and well-nourished.  HENT:  Head: Normocephalic and atraumatic.  Eyes: Pupils are equal, round, and reactive to light.  Neck: Normal range of motion. Neck supple.  Cardiovascular: Normal rate, regular rhythm and normal heart sounds.   Pulmonary/Chest: Effort normal and breath sounds normal. No respiratory distress. She has no wheezes. She has no rales. She exhibits no tenderness.  Abdominal: Soft. Bowel sounds are normal. There is no tenderness. There is no rebound and no guarding.  Musculoskeletal: Normal range of motion. She exhibits no edema.  Lymphadenopathy:    She has no cervical adenopathy.  Neurological: She is alert and oriented to person, place, and time.  Skin: Skin is warm and dry. No rash noted.  Psychiatric: She has a normal mood and affect.    ED Course  Procedures (including critical care time) Labs Review Labs Reviewed  CBC WITH DIFFERENTIAL - Abnormal; Notable for the following:    Hemoglobin 10.1 (*)    HCT 30.8 (*)    MCV 67.8 (*)    MCH 22.2 (*)    RDW 16.0 (*)    Platelets 635 (*)    All other components within normal limits  BASIC METABOLIC PANEL - Abnormal; Notable for the following:    Potassium 3.2 (*)    All other components within normal limits    Imaging Review No results found.   EKG Interpretation None      MDM   Final diagnoses:  Iron deficiency anemia  Hypokalemia    Patient's hemoglobin appears stable. Her potassium is little bit low and she was given a dose of potassium here in the ED. She was encouraged to follow-up with her primary care physician for ongoing management.    Malvin Johns, MD 04/28/14 [phone]

## 2014-05-10 ENCOUNTER — Other Ambulatory Visit (INDEPENDENT_AMBULATORY_CARE_PROVIDER_SITE_OTHER): Payer: Self-pay

## 2014-05-10 DIAGNOSIS — K9186 Retained cholelithiasis following cholecystectomy: Secondary | ICD-10-CM

## 2014-05-20 ENCOUNTER — Ambulatory Visit
Admission: RE | Admit: 2014-05-20 | Discharge: 2014-05-20 | Disposition: A | Discharge status: Home / Self Care | Payer: Commercial Managed Care - PPO | Source: Ambulatory Visit | Attending: Surgery | Admitting: Surgery

## 2014-05-20 DIAGNOSIS — K9186 Retained cholelithiasis following cholecystectomy: Secondary | ICD-10-CM

## 2014-05-20 MED ORDER — GADOBENATE DIMEGLUMINE 529 MG/ML IV SOLN
15.0000 mL | Freq: Once | INTRAVENOUS | Status: AC | PRN
  Administered 2014-05-20: 15 mL via INTRAVENOUS

## 2014-06-25 ENCOUNTER — Emergency Department (HOSPITAL_BASED_OUTPATIENT_CLINIC_OR_DEPARTMENT_OTHER)
Admission: EM | Admit: 2014-06-25 | Discharge: 2014-06-25 | Disposition: A | Discharge status: Home / Self Care | Payer: Commercial Managed Care - PPO | Attending: Emergency Medicine | Admitting: Emergency Medicine

## 2014-06-25 ENCOUNTER — Encounter (HOSPITAL_BASED_OUTPATIENT_CLINIC_OR_DEPARTMENT_OTHER): Payer: Self-pay | Admitting: *Deleted

## 2014-06-25 ENCOUNTER — Emergency Department (HOSPITAL_BASED_OUTPATIENT_CLINIC_OR_DEPARTMENT_OTHER): Payer: Commercial Managed Care - PPO

## 2014-06-25 DIAGNOSIS — Y9389 Activity, other specified: Secondary | ICD-10-CM | POA: Diagnosis not present

## 2014-06-25 DIAGNOSIS — S6992XA Unspecified injury of left wrist, hand and finger(s), initial encounter: Secondary | ICD-10-CM | POA: Diagnosis present

## 2014-06-25 DIAGNOSIS — Z792 Long term (current) use of antibiotics: Secondary | ICD-10-CM | POA: Insufficient documentation

## 2014-06-25 DIAGNOSIS — S62601A Fracture of unspecified phalanx of left index finger, initial encounter for closed fracture: Secondary | ICD-10-CM

## 2014-06-25 DIAGNOSIS — Y9289 Other specified places as the place of occurrence of the external cause: Secondary | ICD-10-CM | POA: Insufficient documentation

## 2014-06-25 DIAGNOSIS — Z79899 Other long term (current) drug therapy: Secondary | ICD-10-CM | POA: Insufficient documentation

## 2014-06-25 DIAGNOSIS — Z862 Personal history of diseases of the blood and blood-forming organs and certain disorders involving the immune mechanism: Secondary | ICD-10-CM | POA: Diagnosis not present

## 2014-06-25 DIAGNOSIS — S62613A Displaced fracture of proximal phalanx of left middle finger, initial encounter for closed fracture: Secondary | ICD-10-CM | POA: Insufficient documentation

## 2014-06-25 DIAGNOSIS — Y998 Other external cause status: Secondary | ICD-10-CM | POA: Diagnosis not present

## 2014-06-25 DIAGNOSIS — F419 Anxiety disorder, unspecified: Secondary | ICD-10-CM | POA: Diagnosis not present

## 2014-06-25 DIAGNOSIS — W16212A Fall in (into) filled bathtub causing other injury, initial encounter: Secondary | ICD-10-CM | POA: Insufficient documentation

## 2014-06-25 DIAGNOSIS — M1990 Unspecified osteoarthritis, unspecified site: Secondary | ICD-10-CM | POA: Diagnosis not present

## 2014-06-25 DIAGNOSIS — I10 Essential (primary) hypertension: Secondary | ICD-10-CM | POA: Insufficient documentation

## 2014-06-25 NOTE — ED Notes (Signed)
Fell last night in tub, tried to catch herself and landed with her left index finger twisted.

## 2014-06-25 NOTE — Discharge Instructions (Signed)

## 2014-06-25 NOTE — ED Provider Notes (Signed)
CSN: [id number]     Arrival date & time 06/25/14  0825 History   First MD Initiated Contact with Patient 06/25/14 0845     Chief Complaint  Patient presents with  . Finger Injury     (Consider location/radiation/quality/duration/timing/severity/associated sxs/prior Treatment) Patient is a 54 y.o. female presenting with hand pain. The history is provided by the patient.  Hand Pain This is a new problem. The current episode started 6 to 12 hours ago. The problem occurs constantly. The problem has not changed since onset.Pertinent negatives include no chest pain and no abdominal pain. Nothing aggravates the symptoms. Nothing relieves the symptoms. She has tried nothing for the symptoms.    Past Medical History  Diagnosis Date  . Anemia   . Arthritis   . Headache(784.0)     history of migraine per pt  . Allergy     allergic rhinitis  . Hypertension   . Anxiety    Past Surgical History  Procedure Laterality Date  . Knee surgery  12/2006    Right  . Endometrial ablation    . Breast surgery    . Cholecystectomy N/A 04/13/2014    Procedure: LAPAROSCOPIC PARTIAL CHOLECYSTECTOMY WITH STONE REMOVAL;  Surgeon: Pedro Earls, MD;  Location: [facility];  Service: General;  Laterality: N/A;   Family History  Problem Relation Age of Onset  . Hypertension Mother   . Migraines Mother   . Arthritis Mother   . Hypertension Father    History  Substance Use Topics  . Smoking status: Never Smoker   . Smokeless tobacco: Never Used  . Alcohol Use: No   OB History    No data available     Review of Systems  Constitutional: Negative for fever and chills.  Cardiovascular: Negative for chest pain.  Gastrointestinal: Negative for abdominal pain.  All other systems reviewed and are negative.     Allergies  Codeine and Vicodin  Home Medications   Prior to Admission medications   Medication Sig Start Date End Date Taking? Authorizing Provider  valsartan (DIOVAN) 160 MG tablet Take  160 mg by mouth daily.   Yes Historical Provider, MD  acetaminophen (TYLENOL) 325 MG tablet Take 2 tablets (650 mg total) by mouth every 6 (six) hours as needed for mild pain, moderate pain, fever or headache. 04/18/14   Earnstine Regal, PA-C  amoxicillin-clavulanate (AUGMENTIN) 875-125 MG per tablet Take 1 tablet by mouth every 12 (twelve) hours. 04/18/14   Earnstine Regal, PA-C  azelastine (ASTELIN) 137 MCG/SPRAY nasal spray Place 2 sprays into the nose daily. Use in each nostril as directed     Historical Provider, MD  DIOVAN HCT 160-25 MG per tablet Take 1 tablet by mouth Daily. 12/07/10   Historical Provider, MD  escitalopram (LEXAPRO) 10 MG tablet Take 10 mg by mouth daily.    Historical Provider, MD  Fe Fum-FePoly-FA-Vit C-Vit B3 (INTEGRA F) 125-1 MG CAPS Take 1 capsule by mouth daily. 08/12/12   Lanice Shirts, MD  Linaclotide (LINZESS) 145 MCG CAPS Take 145 mcg by mouth daily.     Historical Provider, MD  potassium chloride (K-DUR,KLOR-CON) 10 MEQ tablet Take two tablets daily 04/07/13   Lanice Shirts, MD  psyllium (HYDROCIL/METAMUCIL) 95 % PACK Increase your fiber with this or other product of your choice. 04/18/14   Earnstine Regal, PA-C  traMADol (ULTRAM) 50 MG tablet Take 1-2 tablets (50-100 mg total) by mouth every 6 (six) hours as needed for moderate pain. 04/18/14   Percell Miller  Jennings, PA-C   BP 145/81 mmHg  Pulse 95  Temp(Src) 99 F (37.2 C) (Oral)  Resp 18  Ht 5\' 1"  (1.549 m)  Wt 158 lb (71.668 kg)  BMI 29.87 kg/m2  SpO2 100% Physical Exam  Constitutional: She is oriented to person, place, and time. She appears well-developed and well-nourished. No distress.  HENT:  Head: Normocephalic and atraumatic.  Mouth/Throat: Oropharynx is clear and moist.  Eyes: EOM are normal. Pupils are equal, round, and reactive to light.  Neck: Normal range of motion. Neck supple.  Cardiovascular: Normal rate and regular rhythm.  Exam reveals no friction rub.   No murmur  heard. Pulmonary/Chest: Effort normal and breath sounds normal. No respiratory distress. She has no wheezes. She has no rales.  Abdominal: Soft. She exhibits no distension. There is no tenderness. There is no rebound.  Musculoskeletal: Normal range of motion. She exhibits no edema.  Decreased L index finger ROM, swelling on palmar side of MCP with tenderness to palpation.  Neurological: She is alert and oriented to person, place, and time.  Skin: She is not diaphoretic.  Nursing note and vitals reviewed.   ED Course  Procedures (including critical care time) Labs Review Labs Reviewed - No data to display  Imaging Review Dg Finger Index Left  06/25/2014   CLINICAL DATA:  Acute left index finger pain and swelling after falling last night. Initial encounter.  EXAM: LEFT INDEX FINGER 2+V  COMPARISON:  None.  FINDINGS: Small avulsion fracture of indeterminate age is seen arising from the volar aspect of the proximal base of the second middle phalanx. There is no evidence of arthropathy or other focal bone abnormality. Soft tissues are unremarkable.  IMPRESSION: Probable small avulsion fracture of indeterminate age is seen arising from the proximal base of the second middle phalanx.   Electronically Signed   By: Sabino Dick M.D.   On: 06/25/2014 09:20     EKG Interpretation None      MDM   Final diagnoses:  Closed fracture of phalanx of left index finger, initial encounter    25F here with injury to L index finger. R hand dominant.  turly unknown mechanism, but caught herself in a fall and sounds like a hyperextension injury. Decreased flexion, all nerves are working. Concern for ligamentous injury. Xray shows avulsion fracture to 2nd phalanx. Splinted, given Hand f/u with Dr. Amedeo Plenty. I have reviewed all labs and imaging and considered them in my medical decision making.   Evelina Bucy, MD 06/25/14 1320

## 2015-04-24 IMAGING — US US ABDOMEN COMPLETE
1 series · 13 of 25 positions shown · non-contrast
Comparison: None.

CLINICAL DATA: Epigastric and right upper quadrant pain yesterday
after eating cheese steak off of the food truck. Pain worsening
throughout the day.

EXAM:
ULTRASOUND ABDOMEN COMPLETE

[Series 1: us abdomen complete · 0.24mm/px · 13 of 80 slices shown]
[im 1/80]
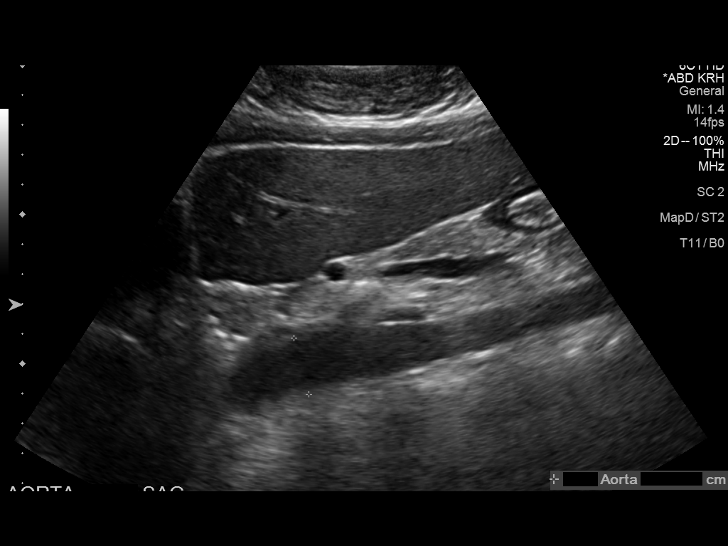
[im 7/80]
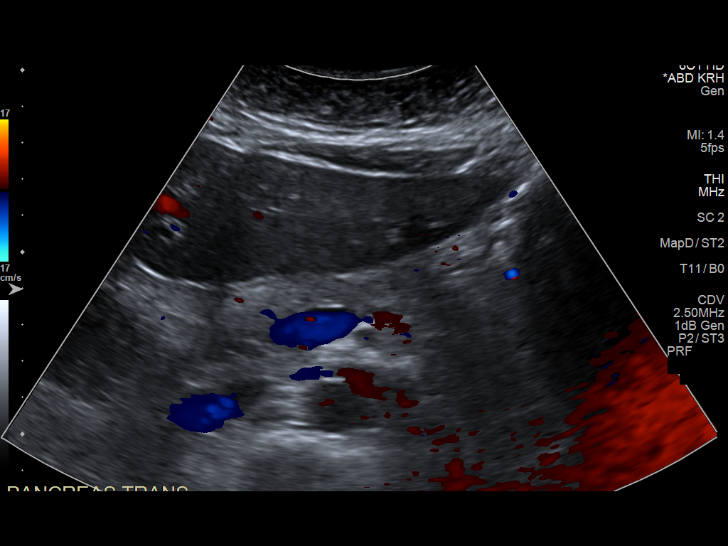
[im 14/80]
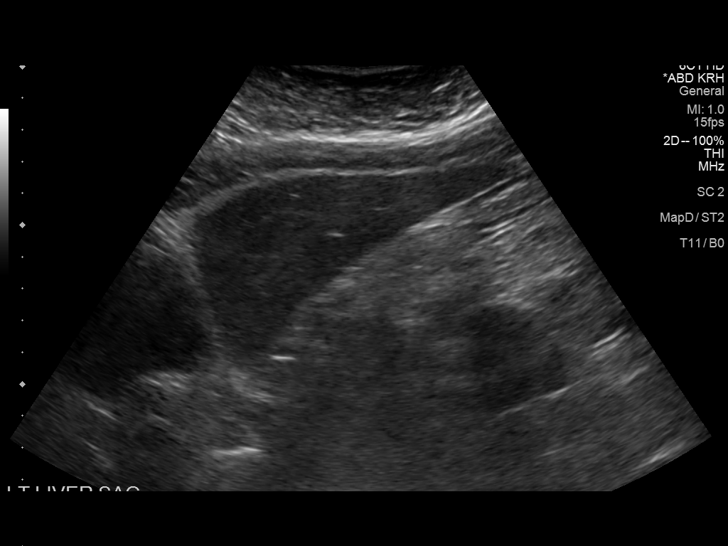
[im 20/80]
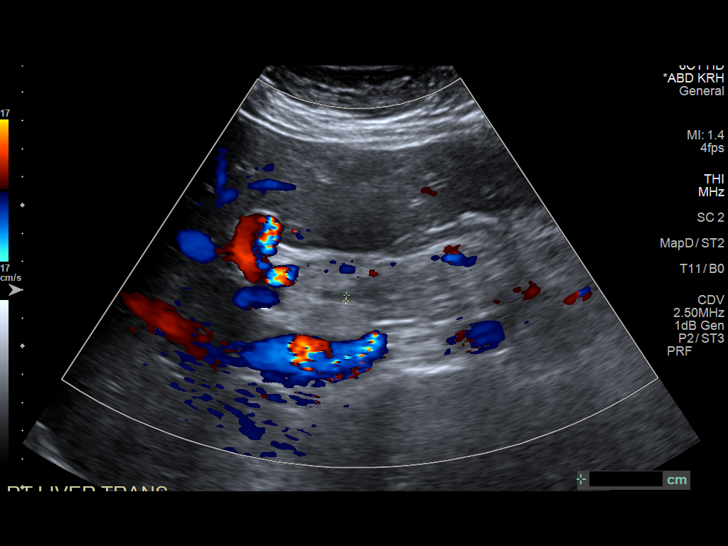
[im 27/80]
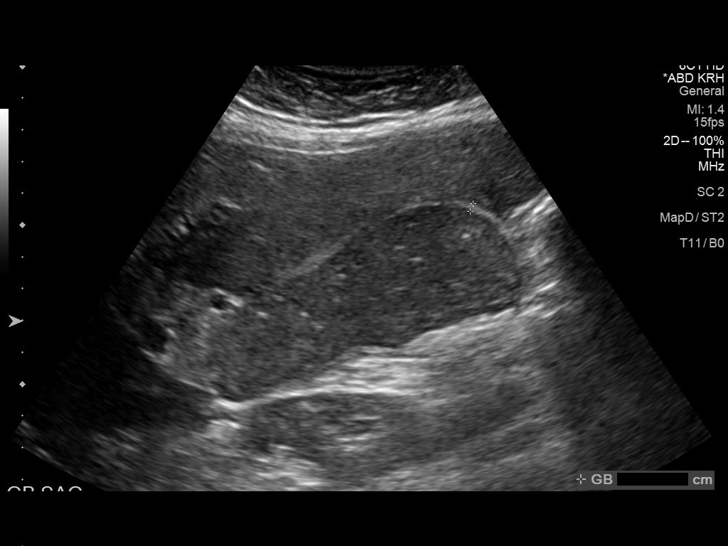
[im 33/80]
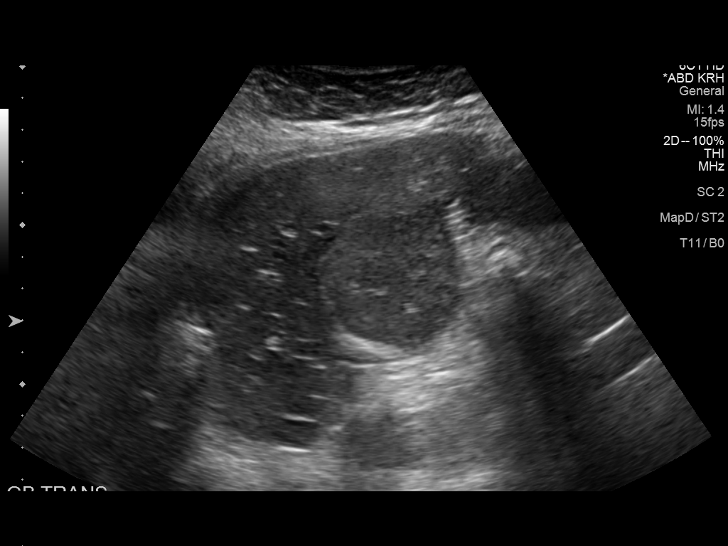
[im 40/80]
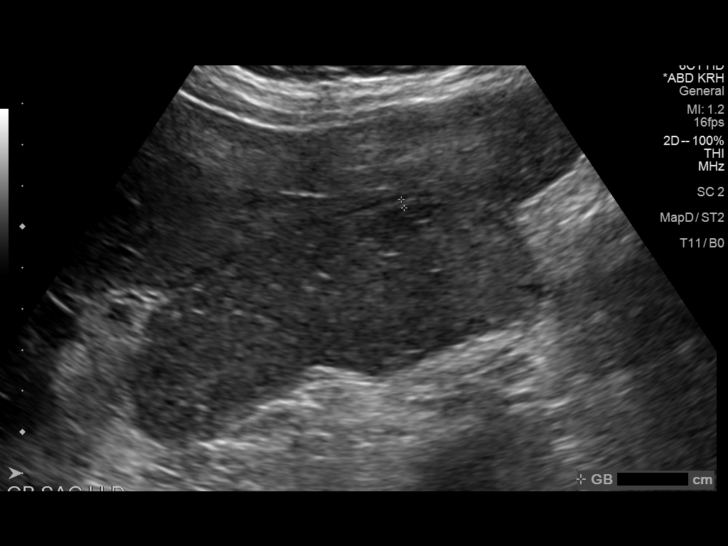
[im 47/80]
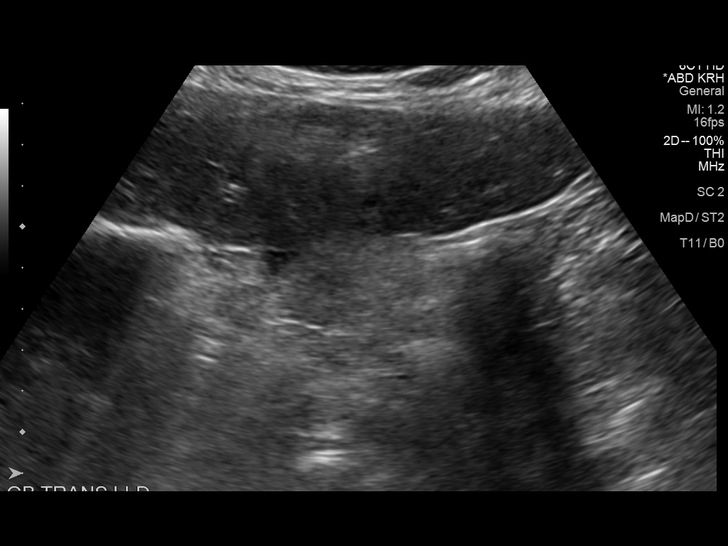
[im 53/80]
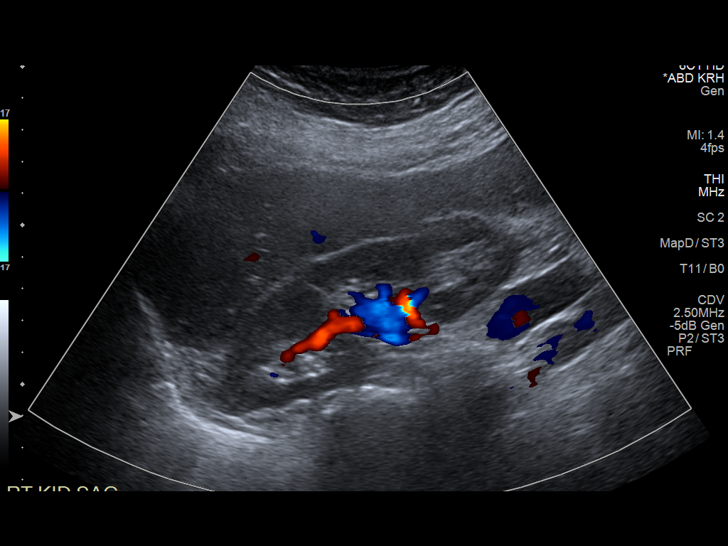
[im 60/80]
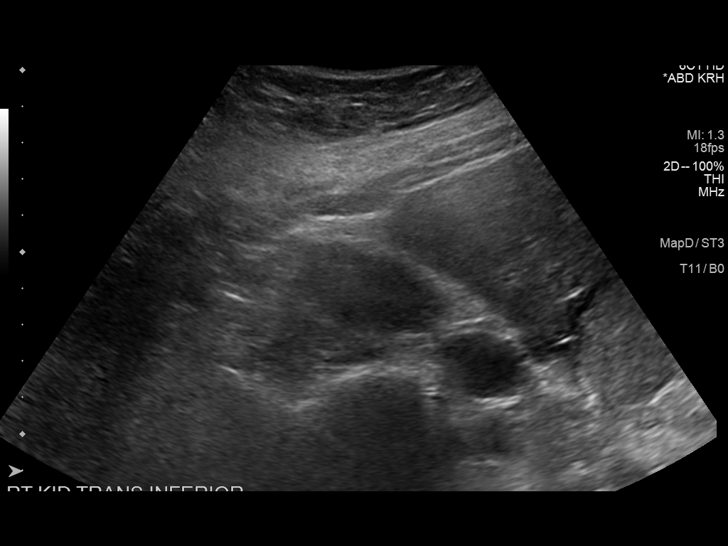
[im 66/80]
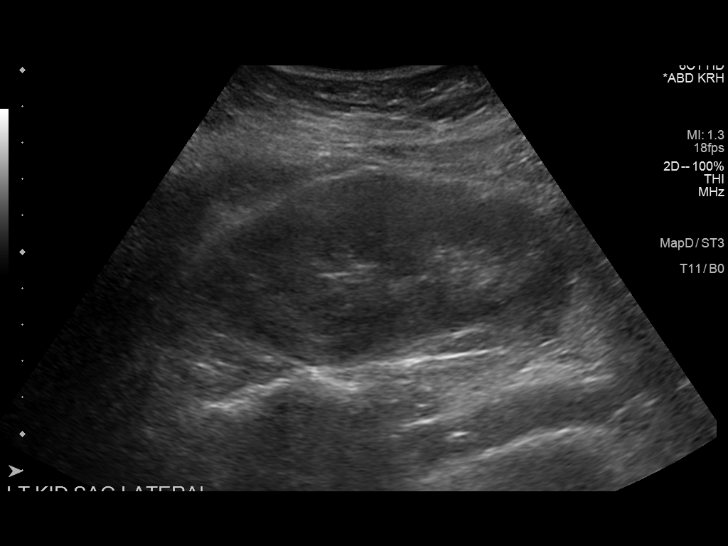
[im 73/80]
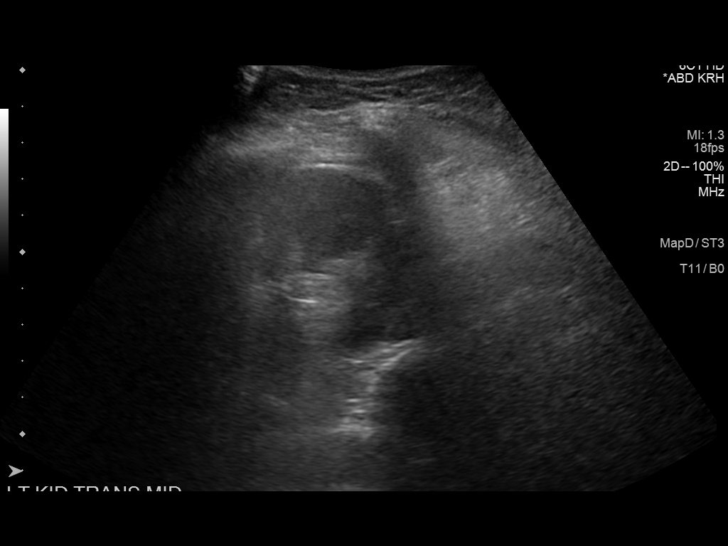
[im 80/80]
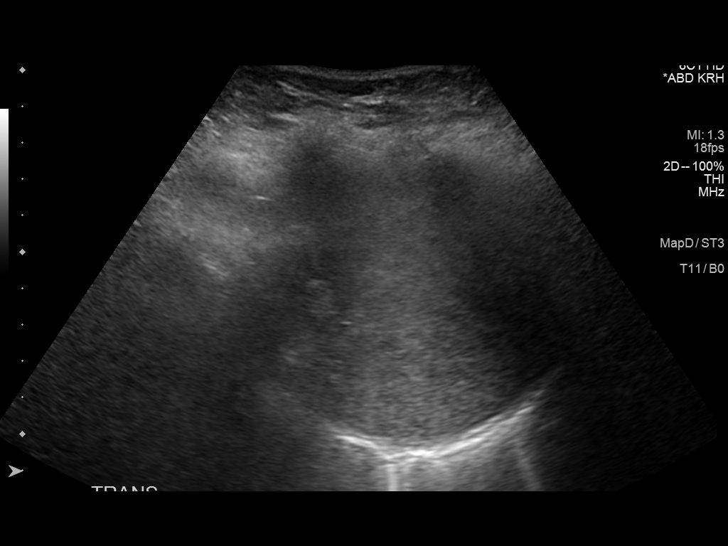

[13 of 25 positions shown; findings below may reference images not displayed]

FINDINGS: Gallbladder: Gallbladder is mildly distended and diffusely filled
with heterogeneous increased echotexture debris, likely indicating
sludge with small stones. Mild gallbladder wall thickening and
edema. Murphy's sign is negative.

Common bile duct: Diameter: 2 mm, normal. Periportal and periductal
tissues are thickened and echogenic suggesting inflammation. Query
cholangitis.

Liver: No focal lesion identified. Within normal limits in
parenchymal echogenicity.

IVC: No abnormality visualized.

Pancreas: Visualized portion unremarkable.

Spleen: Size and appearance within normal limits.

Right Kidney: Length: 11.3 cm. Echogenicity within normal limits. No
mass or hydronephrosis visualized.

Left Kidney: Length: 10.8 cm. Echogenicity within normal limits. No
mass or hydronephrosis visualized.

Abdominal aorta: No aneurysm visualized.

Other findings: None.
IMPRESSION: Gallbladder is distended and completely filled with echogenic
material consistent with sludge and small stones. Mild gallbladder
wall thickening and edema. Murphy's sign is negative. No bile duct
dilatation. Periportal tissues appear inflamed.

## 2017-08-06 ENCOUNTER — Encounter (HOSPITAL_COMMUNITY): Payer: Self-pay | Admitting: *Deleted

## 2017-08-10 NOTE — Patient Instructions (Addendum)
Your procedure is scheduled on: Friday, April 12  Enter through the Main Entrance of [facility] at: 7 am   Pick up the phone at the desk and dial [phone].  Call this number if you have problems the morning of surgery: [phone].  Remember: Do NOT eat or Do NOT drink clear liquids (including water) after midnight Thursday  Take these medicines the morning of surgery with a SIP OF WATER: lexapro and astelin nasal spray  Stop herbal medications, viatmin supplements and ibuprofen 1 week prior to surgery.  Do NOT wear jewelry (body piercing), metal hair clips/bobby pins, make-up, or nail polish. Do NOT wear lotions, powders, or perfumes.  You may wear deoderant. Do NOT shave for 48 hours prior to surgery. Do NOT bring valuables to the hospital.  Have a responsible adult drive you home and stay with you for 24 hours after your procedure.  Home with husband "J.R." cell [phone].

## 2017-08-12 ENCOUNTER — Other Ambulatory Visit: Payer: Self-pay | Admitting: Obstetrics and Gynecology

## 2017-08-19 ENCOUNTER — Other Ambulatory Visit: Payer: Self-pay

## 2017-08-19 ENCOUNTER — Encounter (HOSPITAL_COMMUNITY): Payer: Self-pay

## 2017-08-19 ENCOUNTER — Encounter (HOSPITAL_COMMUNITY)
Admission: RE | Admit: 2017-08-19 | Discharge: 2017-08-19 | Disposition: A | Discharge status: Home / Self Care | Payer: Commercial Managed Care - PPO | Source: Ambulatory Visit | Attending: Obstetrics and Gynecology | Admitting: Obstetrics and Gynecology

## 2017-08-19 DIAGNOSIS — R9431 Abnormal electrocardiogram [ECG] [EKG]: Secondary | ICD-10-CM | POA: Insufficient documentation

## 2017-08-19 DIAGNOSIS — Z01812 Encounter for preprocedural laboratory examination: Secondary | ICD-10-CM | POA: Diagnosis present

## 2017-08-19 DIAGNOSIS — Z01810 Encounter for preprocedural cardiovascular examination: Secondary | ICD-10-CM | POA: Diagnosis present

## 2017-08-19 LAB — BASIC METABOLIC PANEL
Anion gap: 9 (ref 5–15)
BUN: 12 mg/dL (ref 6–20)
CALCIUM: 8.9 mg/dL (ref 8.9–10.3)
CHLORIDE: 101 mmol/L (ref 101–111)
CO2: 26 mmol/L (ref 22–32)
CREATININE: 0.58 mg/dL (ref 0.44–1.00)
GFR calc non Af Amer: >60 mL/min (ref >60)
Glucose, Bld: 84 mg/dL (ref 65–99)
Potassium: 3.7 mmol/L (ref 3.5–5.1)
SODIUM: 136 mmol/L (ref 135–145)

## 2017-08-19 LAB — CBC
HCT: 34.2 % — ABNORMAL LOW (ref 36.0–46.0)
HEMOGLOBIN: 11 g/dL — AB (ref 12.0–15.0)
MCH: 22.4 pg — ABNORMAL LOW (ref 26.0–34.0)
MCHC: 32.2 g/dL (ref 30.0–36.0)
MCV: 69.5 fL — ABNORMAL LOW (ref 78.0–100.0)
Platelets: 425 10*3/uL — ABNORMAL HIGH (ref 150–400)
RBC: 4.92 MIL/uL (ref 3.87–5.11)
RDW: 16.3 % — AB (ref 11.5–15.5)
WBC: 5.9 10*3/uL (ref 4.0–10.5)

## 2017-08-31 ENCOUNTER — Other Ambulatory Visit: Payer: Self-pay | Admitting: Obstetrics and Gynecology

## 2017-09-01 ENCOUNTER — Ambulatory Visit: Payer: Self-pay | Admitting: Obstetrics and Gynecology

## 2017-09-01 NOTE — H&P (Deleted)
  The note originally documented on this encounter has been moved the the encounter in which it belongs.  

## 2017-09-01 NOTE — H&P (Signed)
Samantha Horton is an 57 y.o. female.  Gravida 3 para 2 A1  Pertinent Gynecological History: Menses: post-menopausal Bleeding: post menopausal bleeding Contraception: none DES exposure: denies Blood transfusions: none Sexually transmitted diseases: no past history Previous GYN Procedures: Uterine ablation  Last mammogram: normal Date: Unknown but is currently Last pap: normal Date: February 2019 OB History: G 3, P 2 A1   Menstrual History: Menarche age: Unknown No LMP recorded. Patient has had an ablation.    Past Medical History:  Diagnosis Date  . Allergy    allergic rhinitis  . Anemia   . Anxiety   . Arthritis    knees  . Hypertension     Past Surgical History:  Procedure Laterality Date  . BREAST SURGERY     biopsy -benign  . BREAST SURGERY     reduction  . CESAREAN SECTION     x 3  . CHOLECYSTECTOMY N/A 04/13/2014   Procedure: LAPAROSCOPIC PARTIAL CHOLECYSTECTOMY WITH STONE REMOVAL;  Surgeon: Pedro Earls, MD;  Location: [facility];  Service: General;  Laterality: N/A;  . COLONOSCOPY     polyp  . ENDOMETRIAL ABLATION    . HAND SURGERY     left hand , trigger finger (thumb) release  . KNEE SURGERY  12/2006   Right  . TUBAL LIGATION      Family History  Problem Relation Age of Onset  . Hypertension Mother   . Migraines Mother   . Arthritis Mother   . Hypertension Father     Social History:  reports that she has never smoked. She has never used smokeless tobacco. She reports that she does not drink alcohol or use drugs.  Allergies:  Allergies  Allergen Reactions  . Codeine Nausea And Vomiting  . Vicodin [Hydrocodone-Acetaminophen] Nausea And Vomiting     (Not in a hospital admission)  ROS  There were no vitals taken for this visit. Physical Exam  No results found for this or any previous visit (from the past 24 hour(s)).  No results found.  Assessment/Plan: 57 year old parous female with postmenopausal bleeding Plan hysteroscopy D&C,  possible polypectomy  Deegan Valentino E 09/01/2017, 4:14 PM

## 2017-09-02 ENCOUNTER — Ambulatory Visit: Payer: Self-pay | Admitting: Obstetrics and Gynecology

## 2017-09-03 ENCOUNTER — Ambulatory Visit: Payer: Self-pay | Admitting: Obstetrics and Gynecology

## 2017-09-04 ENCOUNTER — Encounter (HOSPITAL_COMMUNITY)
Admission: AD | Disposition: A | Discharge status: Home / Self Care | Payer: Self-pay | Source: Ambulatory Visit | Attending: Obstetrics and Gynecology

## 2017-09-04 ENCOUNTER — Ambulatory Visit (HOSPITAL_COMMUNITY): Payer: Commercial Managed Care - PPO | Admitting: Anesthesiology

## 2017-09-04 ENCOUNTER — Ambulatory Visit (HOSPITAL_COMMUNITY)
Admission: AD | Admit: 2017-09-04 | Discharge: 2017-09-04 | Disposition: A | Discharge status: Home / Self Care | Payer: Commercial Managed Care - PPO | Source: Ambulatory Visit | Attending: Obstetrics and Gynecology | Admitting: Obstetrics and Gynecology

## 2017-09-04 ENCOUNTER — Encounter (HOSPITAL_COMMUNITY): Payer: Self-pay | Admitting: Emergency Medicine

## 2017-09-04 DIAGNOSIS — I10 Essential (primary) hypertension: Secondary | ICD-10-CM | POA: Diagnosis not present

## 2017-09-04 DIAGNOSIS — F419 Anxiety disorder, unspecified: Secondary | ICD-10-CM | POA: Diagnosis not present

## 2017-09-04 DIAGNOSIS — N840 Polyp of corpus uteri: Secondary | ICD-10-CM | POA: Diagnosis not present

## 2017-09-04 DIAGNOSIS — D250 Submucous leiomyoma of uterus: Secondary | ICD-10-CM | POA: Diagnosis not present

## 2017-09-04 DIAGNOSIS — Z791 Long term (current) use of non-steroidal anti-inflammatories (NSAID): Secondary | ICD-10-CM | POA: Diagnosis not present

## 2017-09-04 DIAGNOSIS — Z7982 Long term (current) use of aspirin: Secondary | ICD-10-CM | POA: Diagnosis not present

## 2017-09-04 DIAGNOSIS — J309 Allergic rhinitis, unspecified: Secondary | ICD-10-CM | POA: Insufficient documentation

## 2017-09-04 DIAGNOSIS — Z79899 Other long term (current) drug therapy: Secondary | ICD-10-CM | POA: Diagnosis not present

## 2017-09-04 DIAGNOSIS — Z9851 Tubal ligation status: Secondary | ICD-10-CM | POA: Diagnosis not present

## 2017-09-04 DIAGNOSIS — N950 Postmenopausal bleeding: Secondary | ICD-10-CM | POA: Insufficient documentation

## 2017-09-04 HISTORY — PX: DILATATION & CURRETTAGE/HYSTEROSCOPY WITH RESECTOCOPE: SHX5572

## 2017-09-04 SURGERY — DILATATION & CURETTAGE/HYSTEROSCOPY WITH RESECTOCOPE
Anesthesia: General

## 2017-09-04 SURGERY — DILATATION AND CURETTAGE /HYSTEROSCOPY
Anesthesia: General

## 2017-09-04 MED ORDER — SODIUM CHLORIDE 0.9 % IJ SOLN
INTRAMUSCULAR | Status: AC
  Filled 2017-09-04: qty 100

## 2017-09-04 MED ORDER — ONDANSETRON HCL 4 MG/2ML IJ SOLN
INTRAMUSCULAR | Status: DC | PRN
  Administered 2017-09-04: 4 mg via INTRAVENOUS

## 2017-09-04 MED ORDER — MEPERIDINE HCL 25 MG/ML IJ SOLN: 6.2500 mg | INTRAMUSCULAR | Status: DC | PRN

## 2017-09-04 MED ORDER — DEXAMETHASONE SODIUM PHOSPHATE 10 MG/ML IJ SOLN
INTRAMUSCULAR | Status: AC
  Filled 2017-09-04: qty 1

## 2017-09-04 MED ORDER — FENTANYL CITRATE (PF) 100 MCG/2ML IJ SOLN
INTRAMUSCULAR | Status: DC | PRN
  Administered 2017-09-04 (×2): 50 ug via INTRAVENOUS

## 2017-09-04 MED ORDER — VASOPRESSIN 20 UNIT/ML IV SOLN
INTRAVENOUS | Status: AC
  Filled 2017-09-04: qty 1

## 2017-09-04 MED ORDER — LIDOCAINE HCL 2 % IJ SOLN
INTRAMUSCULAR | Status: AC
  Filled 2017-09-04: qty 20

## 2017-09-04 MED ORDER — ONDANSETRON HCL 4 MG/2ML IJ SOLN
INTRAMUSCULAR | Status: AC
  Filled 2017-09-04: qty 2

## 2017-09-04 MED ORDER — LIDOCAINE HCL (CARDIAC) 20 MG/ML IV SOLN
INTRAVENOUS | Status: AC
  Filled 2017-09-04: qty 5

## 2017-09-04 MED ORDER — SCOPOLAMINE 1 MG/3DAYS TD PT72
1.0000 | MEDICATED_PATCH | Freq: Once | TRANSDERMAL | Status: DC
  Administered 2017-09-04: 1.5 mg via TRANSDERMAL

## 2017-09-04 MED ORDER — DEXAMETHASONE SODIUM PHOSPHATE 10 MG/ML IJ SOLN
INTRAMUSCULAR | Status: DC | PRN
  Administered 2017-09-04: 10 mg via INTRAVENOUS

## 2017-09-04 MED ORDER — KETOROLAC TROMETHAMINE 30 MG/ML IJ SOLN
INTRAMUSCULAR | Status: AC
  Filled 2017-09-04: qty 1

## 2017-09-04 MED ORDER — PROMETHAZINE HCL 25 MG/ML IJ SOLN: 6.2500 mg | INTRAMUSCULAR | Status: DC | PRN

## 2017-09-04 MED ORDER — SCOPOLAMINE 1 MG/3DAYS TD PT72
MEDICATED_PATCH | TRANSDERMAL | Status: AC
  Administered 2017-09-04: 1.5 mg via TRANSDERMAL
  Filled 2017-09-04: qty 1

## 2017-09-04 MED ORDER — PROPOFOL 10 MG/ML IV BOLUS
INTRAVENOUS | Status: DC | PRN
  Administered 2017-09-04: 150 mg via INTRAVENOUS
  Administered 2017-09-04: 50 mg via INTRAVENOUS

## 2017-09-04 MED ORDER — MIDAZOLAM HCL 5 MG/5ML IJ SOLN
INTRAMUSCULAR | Status: DC | PRN
  Administered 2017-09-04: 2 mg via INTRAVENOUS

## 2017-09-04 MED ORDER — OXYCODONE HCL 5 MG/5ML PO SOLN: 5.0000 mg | Freq: Once | ORAL | Status: DC | PRN

## 2017-09-04 MED ORDER — KETOROLAC TROMETHAMINE 30 MG/ML IJ SOLN
INTRAMUSCULAR | Status: DC | PRN
  Administered 2017-09-04: 30 mg via INTRAVENOUS

## 2017-09-04 MED ORDER — CEFAZOLIN SODIUM-DEXTROSE 2-4 GM/100ML-% IV SOLN
2.0000 g | INTRAVENOUS | Status: AC
  Administered 2017-09-04: 2 g via INTRAVENOUS

## 2017-09-04 MED ORDER — LACTATED RINGERS IV SOLN
INTRAVENOUS | Status: DC
  Administered 2017-09-04 (×2): via INTRAVENOUS

## 2017-09-04 MED ORDER — OXYCODONE HCL 5 MG PO TABS: 5.0000 mg | ORAL_TABLET | Freq: Once | ORAL | Status: DC | PRN

## 2017-09-04 MED ORDER — PROPOFOL 10 MG/ML IV BOLUS
INTRAVENOUS | Status: AC
  Filled 2017-09-04: qty 20

## 2017-09-04 MED ORDER — LIDOCAINE HCL (CARDIAC) 20 MG/ML IV SOLN
INTRAVENOUS | Status: DC | PRN
  Administered 2017-09-04: 50 mg via INTRAVENOUS

## 2017-09-04 MED ORDER — HYDROMORPHONE HCL 1 MG/ML IJ SOLN: 0.2500 mg | INTRAMUSCULAR | Status: DC | PRN

## 2017-09-04 MED ORDER — MIDAZOLAM HCL 2 MG/2ML IJ SOLN
INTRAMUSCULAR | Status: AC
  Filled 2017-09-04: qty 2

## 2017-09-04 MED ORDER — FENTANYL CITRATE (PF) 100 MCG/2ML IJ SOLN
INTRAMUSCULAR | Status: AC
  Filled 2017-09-04: qty 2

## 2017-09-04 SURGICAL SUPPLY — 14 items
BIPOLAR CUTTING LOOP 21FR (ELECTRODE)
CANISTER SUCT 3000ML PPV (MISCELLANEOUS) ×3 IMPLANT
CATH ROBINSON RED A/P 16FR (CATHETERS) ×3 IMPLANT
DILATOR CANAL MILEX (MISCELLANEOUS) IMPLANT
GLOVE BIO SURGEON STRL SZ7 (GLOVE) ×3 IMPLANT
GLOVE BIOGEL PI IND STRL 7.0 (GLOVE) ×2 IMPLANT
GLOVE BIOGEL PI INDICATOR 7.0 (GLOVE) ×4
GOWN STRL REUS W/TWL LRG LVL3 (GOWN DISPOSABLE) ×6 IMPLANT
LOOP CUTTING BIPOLAR 21FR (ELECTRODE) IMPLANT
PACK VAGINAL MINOR WOMEN LF (CUSTOM PROCEDURE TRAY) ×3 IMPLANT
PAD OB MATERNITY 4.3X12.25 (PERSONAL CARE ITEMS) ×3 IMPLANT
TOWEL OR 17X24 6PK STRL BLUE (TOWEL DISPOSABLE) ×6 IMPLANT
TUBING AQUILEX INFLOW (TUBING) ×3 IMPLANT
TUBING AQUILEX OUTFLOW (TUBING) ×3 IMPLANT

## 2017-09-04 NOTE — Discharge Instructions (Signed)
DISCHARGE INSTRUCTIONS: HYSTEROSCOPY  The following instructions have been prepared to help you care for yourself upon your return home.  May Remove Scop patch on or before 09/07/17  May take Ibuprofen after 4:00pm today    Personal hygiene:  Use sanitary pads for vaginal drainage, not tampons.  Shower the day after your procedure.  NO tub baths, pools or Jacuzzis for 2-3 weeks.  Wipe front to back after using the bathroom.  Activity and limitations:  Do NOT drive or operate any equipment for 24 hours. The effects of anesthesia are still present and drowsiness may result.  Do NOT rest in bed all day.  Walking is encouraged.  Walk up and down stairs slowly.  You may resume your normal activity in one to two days or as indicated by your physician. Sexual activity: NO intercourse for at least 2 weeks after the procedure, or as indicated by your Doctor.  Diet: Eat a light meal as desired this evening. You may resume your usual diet tomorrow.  Return to Work: You may resume your work activities in one to two days or as indicated by Marine scientist.  What to expect after your surgery: Expect to have vaginal bleeding/discharge for 2-3 days and spotting for up to 10 days. It is not unusual to have soreness for up to 1-2 weeks. You may have a slight burning sensation when you urinate for the first day. Mild cramps may continue for a couple of days. You may have a regular period in 2-6 weeks.  Call your doctor for any of the following:  Excessive vaginal bleeding or clotting, saturating and changing one pad every hour.  Inability to urinate 6 hours after discharge from hospital.  Pain not relieved by pain medication.  Fever of 100.4 F or greater.  Unusual vaginal discharge or odor.  Return to office _________________Call for an appointment ___________________ Patients signature: ______________________ Nurses signature ________________________  Post Anesthesia Care Unit  [phone]    Post Anesthesia Home Care Instructions  Activity: Get plenty of rest for the remainder of the day. A responsible individual must stay with you for 24 hours following the procedure.  For the next 24 hours, DO NOT: -Drive a car -Paediatric nurse -Drink alcoholic beverages -Take any medication unless instructed by your physician -Make any legal decisions or sign important papers.  Meals: Start with liquid foods such as gelatin or soup. Progress to regular foods as tolerated. Avoid greasy, spicy, heavy foods. If nausea and/or vomiting occur, drink only clear liquids until the nausea and/or vomiting subsides. Call your physician if vomiting continues.  Special Instructions/Symptoms: Your throat may feel dry or sore from the anesthesia or the breathing tube placed in your throat during surgery. If this causes discomfort, gargle with warm salt water. The discomfort should disappear within 24 hours.  If you had a scopolamine patch placed behind your ear for the management of post- operative nausea and/or vomiting:  1. The medication in the patch is effective for 72 hours, after which it should be removed.  Wrap patch in a tissue and discard in the trash. Wash hands thoroughly with soap and water. 2. You may remove the patch earlier than 72 hours if you experience unpleasant side effects which may include dry mouth, dizziness or visual disturbances. 3. Avoid touching the patch. Wash your hands with soap and water after contact with the patch.

## 2017-09-04 NOTE — Brief Op Note (Signed)
09/04/2017  10:24 AM  PATIENT:  Samantha Horton  57 y.o. female  PRE-OPERATIVE DIAGNOSIS:  Post menopausal bleeding, thickened endometrium  POST-OPERATIVE DIAGNOSIS:  Post menopausal bleeding, thickened endometrium  PROCEDURE:  Procedure(s): DILATATION & CURETTAGE/HYSTEROSCOPY WITH RESECTOCOPE (N/A)  SURGEON:  Surgeon(s) and Role:    * Avel Sensor, MD - Primary  PHYSICIAN ASSISTANT:   ASSISTANTS: none   ANESTHESIA:   general  EBL:  5 mL   BLOOD ADMINISTERED:none  DRAINS: none   LOCAL MEDICATIONS USED:  NONE  SPECIMEN:  Source of Specimen:  Endocervical curettage, endometrial curettings and polypectomy.  DISPOSITION OF SPECIMEN:  PATHOLOGY  COUNTS:  YES  TOURNIQUET:  * No tourniquets in log *  DICTATION: .Dragon Dictation  PLAN OF CARE: Discharge to home after PACU  PATIENT DISPOSITION:  PACU - hemodynamically stable.   Delay start of Pharmacological VTE agent (>24hrs) due to surgical blood loss or risk of bleeding: not applicable

## 2017-09-04 NOTE — Anesthesia Procedure Notes (Signed)
Procedure Name: LMA Insertion Date/Time: 09/04/2017 9:38 AM Performed by: Ignacia Bayley, CRNA Pre-anesthesia Checklist: Patient identified, Patient being monitored, Emergency Drugs available, Timeout performed and Suction available Patient Re-evaluated:Patient Re-evaluated prior to induction Oxygen Delivery Method: Circle System Utilized Preoxygenation: Pre-oxygenation with 100% oxygen Induction Type: IV induction Ventilation: Mask ventilation without difficulty LMA: LMA inserted LMA Size: 4.0 Number of attempts: 1 Placement Confirmation: positive ETCO2 and breath sounds checked- equal and bilateral

## 2017-09-04 NOTE — Anesthesia Preprocedure Evaluation (Signed)
Anesthesia Evaluation  Patient identified by MRN, date of birth, ID band Patient awake    Reviewed: Allergy & Precautions, H&P , NPO status , Patient's Chart, lab work & pertinent test results  Airway Mallampati: II  TM Distance: <3 FB Neck ROM: Full    Dental no notable dental hx.    Pulmonary neg pulmonary ROS,    Pulmonary exam normal breath sounds clear to auscultation       Cardiovascular hypertension, Pt. on medications Normal cardiovascular exam Rhythm:Regular Rate:Normal     Neuro/Psych  Headaches, Anxiety negative neurological ROS  negative psych ROS   GI/Hepatic negative GI ROS, Neg liver ROS,   Endo/Other  negative endocrine ROS  Renal/GU negative Renal ROS  negative genitourinary   Musculoskeletal negative musculoskeletal ROS (+) Arthritis , Osteoarthritis,    Abdominal (+) + obese,   Peds negative pediatric ROS (+)  Hematology  (+) anemia ,   Anesthesia Other Findings   Reproductive/Obstetrics negative OB ROS                             Anesthesia Physical  Anesthesia Plan  ASA: II  Anesthesia Plan: General   Post-op Pain Management:    Induction: Intravenous  PONV Risk Score and Plan: 3 and Ondansetron, Dexamethasone and Midazolam  Airway Management Planned: LMA  Additional Equipment:   Intra-op Plan:   Post-operative Plan: Extubation in OR  Informed Consent: I have reviewed the patients History and Physical, chart, labs and discussed the procedure including the risks, benefits and alternatives for the proposed anesthesia with the patient or authorized representative who has indicated his/her understanding and acceptance.   Dental advisory given  Plan Discussed with: CRNA and Surgeon  Anesthesia Plan Comments:         Anesthesia Quick Evaluation

## 2017-09-04 NOTE — Transfer of Care (Signed)
Immediate Anesthesia Transfer of Care Note  Patient: Samantha Horton  Procedure(s) Performed: DILATATION & CURETTAGE/HYSTEROSCOPY WITH RESECTOCOPE (N/A )  Patient Location: PACU  Anesthesia Type:General  Level of Consciousness: sedated  Airway & Oxygen Therapy: Patient Spontanous Breathing and Patient connected to nasal cannula oxygen  Post-op Assessment: Report given to RN and Post -op Vital signs reviewed and stable  Post vital signs: stable  Last Vitals:  Vitals Value Taken Time  BP 148/89 09/04/2017 10:23 AM  Temp    Pulse 99 09/04/2017 10:27 AM  Resp 20 09/04/2017 10:27 AM  SpO2 98 % 09/04/2017 10:27 AM  Vitals shown include unvalidated device data.  Last Pain:  Vitals:   09/04/17 0734  TempSrc: Oral         Complications: No apparent anesthesia complications

## 2017-09-04 NOTE — Anesthesia Postprocedure Evaluation (Signed)
Anesthesia Post Note  Patient: Lidiya Reise  Procedure(s) Performed: DILATATION & CURETTAGE/HYSTEROSCOPY WITH RESECTOCOPE (N/A )     Patient location during evaluation: PACU Anesthesia Type: General Level of consciousness: awake and alert Pain management: pain level controlled Vital Signs Assessment: post-procedure vital signs reviewed and stable Respiratory status: spontaneous breathing, nonlabored ventilation and respiratory function stable Cardiovascular status: blood pressure returned to baseline and stable Postop Assessment: no apparent nausea or vomiting Anesthetic complications: no    Last Vitals:  Vitals:   09/04/17 1115 09/04/17 1130  BP: (!) 149/97   Pulse: 90   Resp: 16 16  Temp:  36.8 C  SpO2: 100% 96%    Last Pain:  Vitals:   09/04/17 1130  TempSrc:   PainSc: 0-No pain   Pain Goal:                 Lynda Rainwater

## 2017-09-05 ENCOUNTER — Encounter (HOSPITAL_COMMUNITY): Payer: Self-pay | Admitting: Obstetrics and Gynecology

## 2017-10-06 ENCOUNTER — Other Ambulatory Visit: Payer: Self-pay | Admitting: Obstetrics and Gynecology

## 2017-10-06 NOTE — Progress Notes (Signed)
Date of Procedure: September 04, 2017    PATIENT:  Samantha Horton  57 y.o. female with postmenopausal bleeding  PRE-OPERATIVE DIAGNOSIS:  Postmenopausal Bleeding  POST-OPERATIVE DIAGNOSIS: Same, plus endometrial polyps and fibroids Procedure: Hysteroscopy and D&C, ECC, polypectomy  PROCEDURE: The patient was placed on the table in a dorsal lithotomy position after anesthesia was induced.  The vagina was thoroughly prepped and draped in the usual fashion.  A single-toothed tenaculum was placed on the cervix and the endocervix was gently curetted.  The cervix was slightly dilated.  The cervix was then dilated more.  The hysteroscope was then grasped and primed and was carefully inserted into the cervix, then the uterine cavity.  There were multiple polypoid lesions noted. Small submucosal fibroids noted.  The biopsy forceps were used to grasp and sample the polyps and the tissue  was handed off.  Next the curette was used to curette the uterine cavity.  And the polyp forces were used to grasp any additional tissue and handed off.  At this point the procedure was terminated.  The patient was awakened and taken to recovery in good condition estimated blood loss minimal approximately 629 cc Complications none Viona Gilmore, MD  SURGEON: Angela Cox,  MD  PHYSICIAN ASSISTANT: None  ASSISTANTS: none   ANESTHESIA:   general  EBL:  100 cc  FINDINGS: Endometrial polyps, fibroids  LOCAL MEDICATIONS USED:  NONE  SPECIMEN:  Source of Specimen:  endometium and endocervix  DISPOSITION OF SPECIMEN:  PATHOLOGY  COUNTS:  YES  DICTATION: .Dragon Dictation  PLAN OF CARE: Discharge to home after PACU  PATIENT DISPOSITION:  PACU - hemodynamically stable.   Delay start of Pharmacological VTE agent (>24hrs) due to surgical blood loss or risk of bleeding:  not applicable

## 2017-10-08 NOTE — Op Note (Signed)
Date of Procedure: September 04, 2017    PATIENT:  Samantha Horton  57 y.o. female with postmenopausal bleeding  PRE-OPERATIVE DIAGNOSIS:  Postmenopausal Bleeding  POST-OPERATIVE DIAGNOSIS: Same, plus endometrial polyps and fibroids Procedure: Hysteroscopy and D&C, ECC, polypectomy  PROCEDURE: The patient was placed on the table in a dorsal lithotomy position after anesthesia was induced.  The vagina was thoroughly prepped and draped in the usual fashion.  A single-toothed tenaculum was placed on the cervix and the endocervix was gently curetted.  The cervix was slightly dilated.  The cervix was then dilated more.  The hysteroscope was then grasped and primed and was carefully inserted into the cervix, then the uterine cavity.  There were multiple polypoid lesions noted. Small submucosal fibroids noted.  The biopsy forceps were used to grasp and sample the polyps and the tissue  was handed off.  Next the curette was used to curette the uterine cavity.  And the polyp forces were used to grasp any additional tissue and handed off.  At this point the procedure was terminated.  The patient was awakened and taken to recovery in good condition estimated blood loss minimal approximately 110 cc Complications none Viona Gilmore, MD  SURGEON: Angela Cox,  MD  PHYSICIAN ASSISTANT: None  ASSISTANTS: none   ANESTHESIA:   general  EBL:  100 cc  FINDINGS: Endometrial polyps, fibroids  LOCAL MEDICATIONS USED:  NONE  SPECIMEN:  Source of Specimen:  endometium and endocervix  DISPOSITION OF SPECIMEN:  PATHOLOGY  COUNTS:  YES  DICTATION: .Dragon Dictation  PLAN OF CARE: Discharge to home after PACU  PATIENT DISPOSITION:  PACU - hemodynamically stable.   Delay start of Pharmacological VTE agent (>24hrs) due to surgical blood loss or risk of bleeding:  not applicable

## 2017-10-08 NOTE — H&P (Signed)
Samantha Horton is an 57 y.o. female.  Gravida 3 para 2 A1  Pertinent Gynecological History: Menses: post-menopausal Bleeding: post menopausal bleeding Contraception: none DES exposure: denies Blood transfusions: none Sexually transmitted diseases: no past history Previous GYN Procedures: Uterine ablation  Last mammogram: normal Date: Unknown but is currently Last pap: normal Date: February 2019 OB History: G 3, P 2 A1            Menstrual History: Menarche age: Unknown No LMP recorded. Patient has had an ablation.      Past Medical History:  Diagnosis Date  . Allergy    allergic rhinitis  . Anemia   . Anxiety   . Arthritis    knees  . Hypertension          Past Surgical History:  Procedure Laterality Date  . BREAST SURGERY     biopsy -benign  . BREAST SURGERY     reduction  . CESAREAN SECTION     x 3  . CHOLECYSTECTOMY N/A 04/13/2014   Procedure: LAPAROSCOPIC PARTIAL CHOLECYSTECTOMY WITH STONE REMOVAL;  Surgeon: Pedro Earls, MD;  Location: [facility];  Service: General;  Laterality: N/A;  . COLONOSCOPY     polyp  . ENDOMETRIAL ABLATION    . HAND SURGERY     left hand , trigger finger (thumb) release  . KNEE SURGERY  12/2006   Right  . TUBAL LIGATION           Family History  Problem Relation Age of Onset  . Hypertension Mother   . Migraines Mother   . Arthritis Mother   . Hypertension Father     Social History:  reports that she has never smoked. She has never used smokeless tobacco. She reports that she does not drink alcohol or use drugs.  Allergies:      Allergies  Allergen Reactions  . Codeine Nausea And Vomiting  . Vicodin [Hydrocodone-Acetaminophen] Nausea And Vomiting     (Not in a hospital admission)  ROS  There were no vitals taken for this visit. Physical Exam  LabResultsLast24Hours  No results found for this or any previous visit (from the past 24 hour(s)).     ImagingResults(Last48hours)  No results found.    Assessment/Plan: 57 year old parous female with postmenopausal bleeding Plan hysteroscopy D&C, possible polypectomy  [name] E 09/01/2017, 4:14 PM

## 2019-08-06 ENCOUNTER — Ambulatory Visit: Payer: Self-pay | Attending: Internal Medicine

## 2019-08-06 DIAGNOSIS — Z23 Encounter for immunization: Secondary | ICD-10-CM

## 2019-08-06 NOTE — Progress Notes (Signed)
   Covid-19 Vaccination Clinic  Name:  Samantha Horton    MRN: [id number] DOB: 08/26/1960  08/06/2019  [name] was observed post Covid-19 immunization for 15 minutes without incident. She was provided with Vaccine Information Sheet and instruction to access the V-Safe system.   [name] was instructed to call 911 with any severe reactions post vaccine: Marland Kitchen Difficulty breathing  . Swelling of face and throat  . A fast heartbeat  . A bad rash all over body  . Dizziness and weakness   Immunizations Administered    Name Date Dose VIS Date Route   Pfizer COVID-19 Vaccine 08/06/2019  1:12 PM 0.3 mL 05/06/2019 Intramuscular   Manufacturer: [place]   Lot: [id number]   [place]: [id number]

## 2019-08-31 ENCOUNTER — Ambulatory Visit: Payer: Self-pay | Attending: Internal Medicine

## 2019-08-31 DIAGNOSIS — Z23 Encounter for immunization: Secondary | ICD-10-CM

## 2019-08-31 NOTE — Progress Notes (Signed)
   Covid-19 Vaccination Clinic  Name:  Samantha Horton    MRN: [id number] DOB: 1961/04/12  08/31/2019  [name] was observed post Covid-19 immunization for 15 minutes without incident. She was provided with Vaccine Information Sheet and instruction to access the V-Safe system.   [name] was instructed to call 911 with any severe reactions post vaccine: Marland Kitchen Difficulty breathing  . Swelling of face and throat  . A fast heartbeat  . A bad rash all over body  . Dizziness and weakness   Immunizations Administered    Name Date Dose VIS Date Route   Pfizer COVID-19 Vaccine 08/31/2019  3:05 PM 0.3 mL 05/06/2019 Intramuscular   Manufacturer: [place]   Lot: [id number]   [place]: [id number]
